# Patient Record
Sex: Male | Born: 1945
Health system: Southern US, Community
[De-identification: ages and names within clinical notes are randomized; demographics above are authoritative.]

## PROBLEM LIST (undated history)

## (undated) DIAGNOSIS — I4891 Unspecified atrial fibrillation: Secondary | ICD-10-CM

## (undated) DIAGNOSIS — E119 Type 2 diabetes mellitus without complications: Secondary | ICD-10-CM

## (undated) DIAGNOSIS — E785 Hyperlipidemia, unspecified: Secondary | ICD-10-CM

## (undated) DIAGNOSIS — I10 Essential (primary) hypertension: Secondary | ICD-10-CM

---

## 1998-03-21 ENCOUNTER — Ambulatory Visit: Admission: RE | Admit: 1998-03-21 | Discharge: 1998-03-21 | Payer: Self-pay | Admitting: Internal Medicine

## 1998-04-18 ENCOUNTER — Ambulatory Visit (HOSPITAL_COMMUNITY): Admission: RE | Admit: 1998-04-18 | Discharge: 1998-04-18 | Payer: Self-pay | Admitting: Internal Medicine

## 1998-05-04 ENCOUNTER — Emergency Department (HOSPITAL_COMMUNITY): Admission: EM | Admit: 1998-05-04 | Discharge: 1998-05-04 | Payer: Self-pay

## 1999-11-09 ENCOUNTER — Ambulatory Visit (HOSPITAL_COMMUNITY): Admission: RE | Admit: 1999-11-09 | Discharge: 1999-11-09 | Payer: Self-pay | Admitting: Urology

## 1999-11-23 ENCOUNTER — Encounter (INDEPENDENT_AMBULATORY_CARE_PROVIDER_SITE_OTHER): Payer: Self-pay | Admitting: Specialist

## 1999-11-23 ENCOUNTER — Ambulatory Visit (HOSPITAL_COMMUNITY): Admission: RE | Admit: 1999-11-23 | Discharge: 1999-11-23 | Payer: Self-pay | Admitting: Urology

## 2000-01-12 ENCOUNTER — Emergency Department (HOSPITAL_COMMUNITY): Admission: EM | Admit: 2000-01-12 | Discharge: 2000-01-12 | Payer: Self-pay

## 2000-01-29 ENCOUNTER — Ambulatory Visit (HOSPITAL_COMMUNITY): Admission: RE | Admit: 2000-01-29 | Discharge: 2000-01-29 | Payer: Self-pay | Admitting: *Deleted

## 2000-01-29 ENCOUNTER — Encounter (INDEPENDENT_AMBULATORY_CARE_PROVIDER_SITE_OTHER): Payer: Self-pay | Admitting: Specialist

## 2001-01-03 ENCOUNTER — Encounter: Payer: Self-pay | Admitting: Urology

## 2001-01-03 ENCOUNTER — Encounter: Admission: RE | Admit: 2001-01-03 | Discharge: 2001-01-03 | Payer: Self-pay | Admitting: Urology

## 2001-01-13 ENCOUNTER — Encounter: Admission: RE | Admit: 2001-01-13 | Discharge: 2001-01-13 | Payer: Self-pay | Admitting: Urology

## 2001-01-13 ENCOUNTER — Encounter: Payer: Self-pay | Admitting: Urology

## 2001-01-27 ENCOUNTER — Encounter: Payer: Self-pay | Admitting: Urology

## 2001-01-27 ENCOUNTER — Ambulatory Visit (HOSPITAL_COMMUNITY): Admission: RE | Admit: 2001-01-27 | Discharge: 2001-01-27 | Payer: Self-pay | Admitting: Urology

## 2001-01-30 ENCOUNTER — Ambulatory Visit (HOSPITAL_COMMUNITY): Admission: RE | Admit: 2001-01-30 | Discharge: 2001-01-30 | Payer: Self-pay | Admitting: Urology

## 2007-03-05 ENCOUNTER — Encounter (INDEPENDENT_AMBULATORY_CARE_PROVIDER_SITE_OTHER): Payer: Self-pay | Admitting: *Deleted

## 2007-03-05 ENCOUNTER — Ambulatory Visit (HOSPITAL_COMMUNITY): Admission: RE | Admit: 2007-03-05 | Discharge: 2007-03-05 | Payer: Self-pay | Admitting: *Deleted

## 2011-01-02 NOTE — Op Note (Signed)
Manuel Turner, Manuel Turner                ACCOUNT NO.:  [id number]   MEDICAL RECORD NO.:  [id number]          PATIENT TYPE:  AMB   LOCATION:  ENDO                         FACILITY:  [facility]   PHYSICIAN:  Georgiana Spinner, M.D.    DATE OF BIRTH:  05-14-1946   DATE OF PROCEDURE:  03/05/2007  DATE OF DISCHARGE:                               OPERATIVE REPORT   PROCEDURE:  Colonoscopy.   ENDOSCOPIST:  Georgiana Spinner, M.D.   INDICATIONS:  Colon polyps.   ANESTHESIA:  Demerol 120 mg, Versed 15 mg.   PROCEDURE:  With the patient mildly sedated in the left lateral  decubitus position, the Pentax videoscopic colonoscope was inserted into  the rectum after a rectal examination was attempted; it was passed under  direct vision with pressure applied to reach the cecum, identified by  base of cecum and ileocecal valve, both of which were photographed.  In  the cecum was a small polyp which was photographed and removed using hot  biopsy forceps technique, setting of 20/150 blended current.  We then  withdrew the colonoscope, taking circumferential views of the remaining  colonic mucosa, stopping at the splenic flexure area, where a second  polyp was seen, photographed and it too was removed, this time using  snare cautery technique, again with a setting of 20/150 blended current.  We then withdrew all the way to the rectum, which appeared normal on  direct and showed hemorrhoids on retroflexed view and a small polyp on  retroflexed view that was removed using hot biopsy forceps technique  with the same setting.  The endoscope was straightened and withdrawn.  The patient's vital signs and pulse oximetry remained stable.  The  patient tolerated the procedure well and without apparent complications.   FINDINGS:  Polyp of cecum and rectum and a larger polyp at the splenic  flexure, all removed, await biopsy report.  The patient will call me for  results and follow up with me as an outpatient.     ______________________________  Georgiana Spinner, M.D.     GMO/MEDQ  D:  03/05/2007  T:  03/06/2007  Job:  [id number]

## 2011-01-05 NOTE — Procedures (Signed)
[facility]  Patient:    Manuel Turner, Manuel Turner                       MRN: [id number] Proc. Date: 01/29/00 Adm. Date:  [id number] Disc. Date: [id number] Attending:  Sabino Gasser                           Procedure Report  PROCEDURE PERFORMED:  Colonoscopy.  ENDOSCOPIST:  Sabino Gasser, M.D.  INDICATIONS FOR PROCEDURE:  Rectal pain, colon polyps, family history of colon cancer.  ANESTHESIA:  Demerol 100 mg, Versed 10 mg was given intravenously in divided dose.  DESCRIPTION OF PROCEDURE:  With the patient mildly sedated in the left lateral decubitus position, the Olympus videoscopic colonoscope was inserted in the rectum and passed under direct vision into the cecum.  The cecum was identified by the ileocecal valve and appendiceal orifice, both of which were photographed.  From this point, the colonoscope was slowly withdrawn, taking circumferential views of the entire colonic mucosa stopping only in the ascending colon where a small polyp was seen and photographed and using hot biopsy forceps technique setting at 20/20 blended current, it was removed. The endoscope was then withdrawn all the way to approximately 25 cm where a second small polyp was seen and photographed and removed using hot biopsy forceps technique setting of 20/20 blended current.  The colonoscope was next pulled back to the rectum which appeared normal on direct view and showed internal hemorrhoids on retroflex view.  The endoscope was straightened and withdrawn.  Patients vital signs and pulse oximeter remained stable.  The patient tolerated the procedure well and without apparent complications.  FINDINGS:  Polyps of the ascending colon at 25 cm from the anal verge.  Await biopsy report.  Patient will call me for results and follow up with me as needed as an outpatient.  Additionally, internal hemorrhoids seen. DD:  01/29/00 TD:  01/31/00 Job: [id number]

## 2011-01-05 NOTE — Op Note (Signed)
[facility]  Patient:    Manuel Turner, Manuel Turner                       MRN: [id number] Proc. Date: 01/30/01 Adm. Date:  [id number] Attending:  Laqueta Jean                           Operative Report  PREOPERATIVE DIAGNOSIS.  Left hydronephrosis.  POSTOPERATIVE DIAGNOSIS:  Left ureteropelvic junction obstruction.  OPERATION:  Cystourethroscopy, left retrograde pyelogram, left ureteroscopy.  SURGEON:  Sigmund I. Patsi Sears, M.D.  ANESTHESIA:  General (LMA).  PREPARATION:  After appropriate preanesthesia, the patient was brought to the operating room, placed on the operating table in the dorsal supine position where general LMA anesthesia was introduced.  He was then replaced in the dorsal lithotomy position where the pubis was prepped with Betadine solution and draped in the usual fashion.  DESCRIPTION OF PROCEDURE:  Cystoscopy revealed normal-appearing bladder, and left retrograde pyelogram revealed normal-appearing ureter with a very definite high insertion, very tight and narrow UPJ with caliectasis, and a distended renal pelvis.  Ureteroscope was attempted to be passed into this area, but the ureter was too small and too tight for a comfortable passage of the , equal to the ureteroscope.  Therefore, the wire was removed, and Xylocaine jelly placed in the bladder.  The patient was then awakened after a B&O suppository given.  The patient was taken to the recovery room in good condition. DD:  01/30/01 TD:  01/30/01 Job: [id number]

## 2013-08-10 ENCOUNTER — Encounter: Payer: Self-pay | Admitting: Nurse Practitioner

## 2013-08-10 NOTE — Progress Notes (Signed)
This encounter was created in error - please disregard.

## 2014-03-10 ENCOUNTER — Emergency Department (HOSPITAL_BASED_OUTPATIENT_CLINIC_OR_DEPARTMENT_OTHER)
Admission: EM | Admit: 2014-03-10 | Discharge: 2014-03-10 | Disposition: A | Discharge status: Home / Self Care | Payer: Medicare HMO | Attending: Emergency Medicine | Admitting: Emergency Medicine

## 2014-03-10 ENCOUNTER — Encounter (HOSPITAL_BASED_OUTPATIENT_CLINIC_OR_DEPARTMENT_OTHER): Payer: Self-pay | Admitting: Emergency Medicine

## 2014-03-10 ENCOUNTER — Emergency Department (HOSPITAL_BASED_OUTPATIENT_CLINIC_OR_DEPARTMENT_OTHER): Payer: Medicare HMO

## 2014-03-10 DIAGNOSIS — E119 Type 2 diabetes mellitus without complications: Secondary | ICD-10-CM | POA: Diagnosis not present

## 2014-03-10 DIAGNOSIS — S79919A Unspecified injury of unspecified hip, initial encounter: Secondary | ICD-10-CM | POA: Insufficient documentation

## 2014-03-10 DIAGNOSIS — I4891 Unspecified atrial fibrillation: Secondary | ICD-10-CM | POA: Insufficient documentation

## 2014-03-10 DIAGNOSIS — I10 Essential (primary) hypertension: Secondary | ICD-10-CM | POA: Diagnosis present

## 2014-03-10 DIAGNOSIS — S7010XA Contusion of unspecified thigh, initial encounter: Secondary | ICD-10-CM | POA: Insufficient documentation

## 2014-03-10 DIAGNOSIS — M25512 Pain in left shoulder: Secondary | ICD-10-CM

## 2014-03-10 DIAGNOSIS — R631 Polydipsia: Secondary | ICD-10-CM | POA: Diagnosis not present

## 2014-03-10 DIAGNOSIS — Y9289 Other specified places as the place of occurrence of the external cause: Secondary | ICD-10-CM | POA: Diagnosis not present

## 2014-03-10 DIAGNOSIS — Y9389 Activity, other specified: Secondary | ICD-10-CM | POA: Diagnosis not present

## 2014-03-10 DIAGNOSIS — Z7902 Long term (current) use of antithrombotics/antiplatelets: Secondary | ICD-10-CM | POA: Insufficient documentation

## 2014-03-10 DIAGNOSIS — Z7901 Long term (current) use of anticoagulants: Secondary | ICD-10-CM | POA: Diagnosis not present

## 2014-03-10 DIAGNOSIS — Z79899 Other long term (current) drug therapy: Secondary | ICD-10-CM | POA: Insufficient documentation

## 2014-03-10 DIAGNOSIS — R296 Repeated falls: Secondary | ICD-10-CM | POA: Insufficient documentation

## 2014-03-10 DIAGNOSIS — S4980XA Other specified injuries of shoulder and upper arm, unspecified arm, initial encounter: Secondary | ICD-10-CM | POA: Insufficient documentation

## 2014-03-10 DIAGNOSIS — E785 Hyperlipidemia, unspecified: Secondary | ICD-10-CM | POA: Diagnosis not present

## 2014-03-10 DIAGNOSIS — R42 Dizziness and giddiness: Secondary | ICD-10-CM | POA: Diagnosis not present

## 2014-03-10 DIAGNOSIS — S46909A Unspecified injury of unspecified muscle, fascia and tendon at shoulder and upper arm level, unspecified arm, initial encounter: Secondary | ICD-10-CM | POA: Insufficient documentation

## 2014-03-10 DIAGNOSIS — S79929A Unspecified injury of unspecified thigh, initial encounter: Secondary | ICD-10-CM

## 2014-03-10 HISTORY — DX: Essential (primary) hypertension: I10

## 2014-03-10 HISTORY — DX: Type 2 diabetes mellitus without complications: E11.9

## 2014-03-10 HISTORY — DX: Hyperlipidemia, unspecified: E78.5

## 2014-03-10 HISTORY — DX: Unspecified atrial fibrillation: I48.91

## 2014-03-10 MED ORDER — HYDROCODONE-ACETAMINOPHEN 5-325 MG PO TABS
2.0000 | ORAL_TABLET | Freq: Once | ORAL | Status: AC
  Administered 2014-03-10: 2 via ORAL
  Filled 2014-03-10: qty 2

## 2014-03-10 MED ORDER — HYDROCODONE-ACETAMINOPHEN 5-325 MG PO TABS: 1.0000 | ORAL_TABLET | Freq: Four times a day (QID) | ORAL | Status: DC | PRN

## 2014-03-10 NOTE — ED Notes (Signed)
Pt reports elevated BP today. Sts taking it 5-6 times with different HIGH readings. Reports some dizziness this morning. Denies CP, nausea, vomiting.

## 2014-03-10 NOTE — ED Notes (Signed)
MD at bedside. 

## 2014-03-10 NOTE — Discharge Instructions (Signed)

## 2014-03-10 NOTE — ED Provider Notes (Signed)
CSN: [id number]     Arrival date & time 03/10/14  1800 History   This chart was scribed for Manuel Hait, MD, by Yevette Edwards, ED Scribe. This patient was seen in room MH07/MH07 and the patient's care was started at 6:27 PM.  First MD Initiated Contact with Patient 03/10/14 1816     Chief Complaint  Patient presents with  . Hypertension    Patient is a 68 y.o. male presenting with hypertension. The history is provided by the patient. No language interpreter was used.  Hypertension This is a new problem. The current episode started 6 to 12 hours ago. The problem occurs constantly. The problem has not changed since onset.Pertinent negatives include no chest pain, no headaches and no shortness of breath. Nothing aggravates the symptoms. The symptoms are relieved by medications. He has tried rest and ASA for the symptoms. The treatment provided moderate relief.   HPI Comments: Manuel Turner is a 68 y.o. male, with a h/o DM, HTN, and hyperlipidemia, who presents to the Emergency Department complaining of elevated BP which was measured today.  Manuel Turner states his BP was 151/119 at home this morning. The pt denies blurred vision, a headache, chest pain, or SOB. He reports he has taken his medication as prescribed.  The pt reports normal BPs of 140/70; in the ED his BP is 168/91. He voices a fall four days ago in which he fell upon his left arm, left hip and left knee when his puppy's chain became wrapped around his ankles. He reports pain associated with the sites, and the pain has decreased his appetite. Manuel Turner also questions if the fall or subsequent pain could contribute to his elevated BP. He has used 0.5 tablet of hydrocodone to mitigate the pain with temporary relief.    Past Medical History  Diagnosis Date  . Diabetes mellitus without complication   . Hypertension   . Hyperlipemia   . Atrial fibrillation    History reviewed. No pertinent past surgical history. No family history  on file. History  Substance Use Topics  . Smoking status: Not on file  . Smokeless tobacco: Not on file  . Alcohol Use: No    Review of Systems  Eyes: Negative for visual disturbance.  Respiratory: Negative for shortness of breath.   Cardiovascular: Negative for chest pain.  Endocrine: Positive for polydipsia.  Musculoskeletal: Positive for myalgias.  Neurological: Positive for dizziness. Negative for headaches.  All other systems reviewed and are negative.   Allergies  Review of patient's allergies indicates not on file.  Home Medications   Prior to Admission medications   Medication Sig Start Date End Date Taking? Authorizing Provider  atorvastatin (LIPITOR) 20 MG tablet Take 20 mg by mouth daily.   Yes Historical Provider, MD  clopidogrel (PLAVIX) 75 MG tablet Take 75 mg by mouth daily.   Yes Historical Provider, MD  digoxin (LANOXIN) 0.125 MG tablet Take 0.375 mg by mouth daily.   Yes Historical Provider, MD  eplerenone (INSPRA) 25 MG tablet Take 25 mg by mouth daily.   Yes Historical Provider, MD  losartan (COZAAR) 25 MG tablet Take 25 mg by mouth daily.   Yes Historical Provider, MD  metFORMIN (GLUCOPHAGE) 1000 MG tablet Take 1,000 mg by mouth 2 (two) times daily with a meal.   Yes Historical Provider, MD  warfarin (COUMADIN) 4 MG tablet Take 4 mg by mouth daily.   Yes Historical Provider, MD   Triage Vitals: BP 168/91  Pulse  82  Temp(Src) 98.5 F (36.9 C) (Oral)  Resp 16  Ht 6\' 2"  (1.88 m)  Wt 250 lb (113.399 kg)  BMI 32.08 kg/m2  SpO2 100%  Physical Exam  Nursing note and vitals reviewed. Constitutional: He is oriented to person, place, and time. He appears well-developed and well-nourished. No distress.  HENT:  Head: Normocephalic and atraumatic.  Eyes: Conjunctivae and EOM are normal.  Neck: Neck supple. No tracheal deviation present.  Cardiovascular: Normal rate.   Pulmonary/Chest: Effort normal. No respiratory distress.  Musculoskeletal: Normal range of  motion.  Neurological: He is alert and oriented to person, place, and time.  Skin: Skin is warm and dry.  20 cm long to 10 cm wide bruise to left thigh with underlying hematoma and mild tenderness.   Psychiatric: He has a normal mood and affect. His behavior is normal.    ED Course  Procedures (including critical care time)  DIAGNOSTIC STUDIES: Oxygen Saturation is 100% on room air, normal by my interpretation.    COORDINATION OF CARE:  6:42 PM- Discussed treatment plan with patient, and the patient agreed to the plan. The plan includes Vicodin, muscle relaxants, and imaging of his left shoulder.   Labs Review Labs Reviewed - No data to display  Imaging Review Dg Hip Complete Left  03/10/2014   CLINICAL DATA:  Status post fall 4 days ago.  Left hip pain.  EXAM: LEFT HIP - COMPLETE 2+ VIEW  COMPARISON:  None.  FINDINGS: There is no acute bony or joint abnormality. No notable degenerative change is seen. No focal bony lesion is identified. Soft tissue structures are unremarkable.  IMPRESSION: Negative exam.   Electronically Signed   By: Drusilla Kannerhomas  Dalessio M.D.   On: 03/10/2014 19:26   Dg Shoulder Left  03/10/2014   CLINICAL DATA:  Status post fall 4 days ago.  Left shoulder pain.  EXAM: LEFT SHOULDER - 2+ VIEW  COMPARISON:  None.  FINDINGS: The humerus is located and the acromioclavicular joint is intact. Mild acromioclavicular degenerative change is seen. Imaged left lung and ribs are unremarkable.  IMPRESSION: Negative exam.   Electronically Signed   By: Drusilla Kannerhomas  Dalessio M.D.   On: 03/10/2014 19:25     EKG Interpretation None      MDM   Final diagnoses:  Essential hypertension  Shoulder pain, acute, left    43M presents with HTN, hip and shoulder pain. HTN this morning, hasn't missed any meds. Denies CP, SOB, HA, blurry vision. Stated some mild dizziness and thirst this morning, but hasn't been eating due to the pain in his shoulder. Fell 4 days ago in the driveway. Caught  himself with his left arm, having some L shoulder pain.  No signs of Hypertensive Emergency, likely hypertensive due to pain. Large bruise on L lateral buttock. Spasm in L deltoid. Will xray hip and shoulder. Pain meds given.  Xrays ok. Stable for discharge.   I personally performed the services described in this documentation, which was scribed in my presence. The recorded information has been reviewed and is accurate.     Manuel HaitWilliam Bay Wayson, MD 03/10/14 [phone]

## 2014-03-10 NOTE — ED Notes (Signed)
Pt c/o increased BP 151/102 .

## 2014-03-16 ENCOUNTER — Encounter: Payer: Self-pay | Admitting: Interventional Cardiology

## 2014-03-16 ENCOUNTER — Ambulatory Visit (INDEPENDENT_AMBULATORY_CARE_PROVIDER_SITE_OTHER): Payer: Medicare HMO | Admitting: Interventional Cardiology

## 2014-03-16 VITALS — BP 150/90 | HR 84 | Ht 74.0 in | Wt 247.0 lb

## 2014-03-16 DIAGNOSIS — I4891 Unspecified atrial fibrillation: Secondary | ICD-10-CM

## 2014-03-16 DIAGNOSIS — R600 Localized edema: Secondary | ICD-10-CM

## 2014-03-16 DIAGNOSIS — I10 Essential (primary) hypertension: Secondary | ICD-10-CM | POA: Insufficient documentation

## 2014-03-16 DIAGNOSIS — I482 Chronic atrial fibrillation, unspecified: Secondary | ICD-10-CM

## 2014-03-16 DIAGNOSIS — R609 Edema, unspecified: Secondary | ICD-10-CM

## 2014-03-16 DIAGNOSIS — Z7901 Long term (current) use of anticoagulants: Secondary | ICD-10-CM

## 2014-03-16 NOTE — Progress Notes (Signed)
Patient ID: Manuel Turner, male   DOB: 28-Oct-1945, 68 y.o.   MRN: [id number]   Date: 03/16/2014 ID: Manuel Turner, DOB 07/04/1946, MRN [id number] PCP: No primary provider on file.  Reason: Atrial fibrillation, self-referred  ASSESSMENT;  1. Chronic atrial fibrillation, first diagnosed in 1985 with associated LV enlargement and decreased function that is subsequently resolved according to the patient. 2. Hypertension 3. Hyperlipidemia 4. Diabetes mellitus  PLAN:  1. Continue the current medical regimen until I have the opportunity to review records from [facility] 2. We'll consider decreasing the dose of digoxin as the patient ages 89. I need to document the patient's current LV systolic function. I would not order any tests until we see him back in 6 months 4. Clinical followup in 6 months.   SUBJECTIVE: Manuel Turner is a 68 y.o. male who is who is here to establish for followup of chronic atrial fibrillation. He was first diagnosed in 88. At that time there was also evidence of left ventricular dysfunction and LV enlargement. LV function according to the patient improved after management of atrial fibrillation was completed. He is done well since that time previous to be taken care of by Dr. Aggie Cosier. Dr. Lucas Mallow retired 4 years ago he has not seen a cardiologist since that time. His INR symptom management Dr. Richardson Dopp had had decreased [facility]. He has no specific cardiovascular complaints. He has not had bleeding on anticoagulant therapy. He denies neurological complaints. He has bilateral lower extremity edema.   Not on File  Current Outpatient Prescriptions on File Prior to Visit  Medication Sig Dispense Refill  . atorvastatin (LIPITOR) 20 MG tablet Take 20 mg by mouth daily.      . digoxin (LANOXIN) 0.125 MG tablet Take 0.375 mg by mouth daily.      Marland Kitchen eplerenone (INSPRA) 25 MG tablet Take 25 mg by mouth daily.      Marland Kitchen HYDROcodone-acetaminophen  (NORCO/VICODIN) 5-325 MG per tablet Take 1 tablet by mouth every 6 (six) hours as needed for moderate pain or severe pain.  15 tablet  0  . metFORMIN (GLUCOPHAGE) 1000 MG tablet Take 1,000 mg by mouth 2 (two) times daily with a meal.      . warfarin (COUMADIN) 4 MG tablet Take 4 mg by mouth daily.       No current facility-administered medications on file prior to visit.    Past Medical History  Diagnosis Date  . Diabetes mellitus without complication   . Hypertension   . Hyperlipemia   . Atrial fibrillation     No past surgical history on file.  History   Social History  . Marital Status: Married    Spouse Name: N/A    Number of Children: N/A  . Years of Education: N/A   Occupational History  . Not on file.   Social History Main Topics  . Smoking status: Former Games developer  . Smokeless tobacco: Not on file  . Alcohol Use: No  . Drug Use: Not on file  . Sexual Activity: Not on file   Other Topics Concern  . Not on file   Social History Narrative  . No narrative on file    No family history on file.  ROS: He has had no transient neurological complaints. No blood in his urine or stool. He has bilateral lower extremity edema, right leg greater than left. No history of DVT. He's been on chronic anticoagulation therapy since 1985. No history of  stroke. No history of coronary disease. No history of syncope. He denies palpitations.. Other systems negative for complaints.  OBJECTIVE: BP 150/90  Pulse 84  Ht  (1.88 m)  Wt 247 lb (112.038 kg)  BMI 31.70 kg/m2,  General: No acute distress, obese but otherwise healthy-appearing HEENT: normal no pallor or jaundice Neck: JVD mild elevation with the patient lying at 30 absent elevation with the patient lying at 45. Carotids no bruits are heard Chest: Clear Cardiac: Murmur: None. Gallop: None. Rhythm: Irregularly irregular. Other: Normal Abdomen: Bruit: Absent. Pulsation: Absent Extremities: Edema: 2+ right lower extremity  1-2+ left lower extremity. Pulses: 2+ and symmetric Neuro: Normal Psych: Normal  ECG: A. fib with controlled ventricular response at 85 beats per min

## 2014-03-16 NOTE — Patient Instructions (Signed)
Your physician recommends that you continue on your current medications as directed. Please refer to the Current Medication list given to you today.  Your physician wants you to follow-up in: 6 months with Dr.Smith You will receive a reminder letter in the mail two months in advance. If you don't receive a letter, please call our office to schedule the follow-up appointment.  

## 2014-06-24 ENCOUNTER — Encounter (HOSPITAL_BASED_OUTPATIENT_CLINIC_OR_DEPARTMENT_OTHER): Payer: Medicare PPO | Attending: Internal Medicine

## 2014-06-24 DIAGNOSIS — L97519 Non-pressure chronic ulcer of other part of right foot with unspecified severity: Secondary | ICD-10-CM | POA: Insufficient documentation

## 2014-06-24 DIAGNOSIS — E11621 Type 2 diabetes mellitus with foot ulcer: Secondary | ICD-10-CM | POA: Insufficient documentation

## 2014-06-24 NOTE — Progress Notes (Signed)
[facility]  NAME:  Manuel Turner, Manuel Turner                ACCOUNT NO.:  [id number]  MEDICAL RECORD NO.:  [id number]      DATE OF BIRTH:  1945-09-01  PHYSICIAN:  Maxwell CaulMichael G. Imari Sivertsen, M.D. VISIT DATE:  06/24/2014                                  OFFICE VISIT   HISTORY OF PRESENT ILLNESS:  Manuel Turner is a 68 year old man kindly referred by [facility] Ad[facility]ankennis [facility]ut [facility].  He is a gentleman who has type 2 diabetes, on metformin.  He tells me that he was mowing his lawn in June with no socks under his shoes.  He developed a large blister on the plantar aspect of his right great toe extending to the plantar aspect of the first metatarsal phalangeal joint. Eventually, the blister resolved; however, he was left with an open wound on the plantar aspect of his right great toe.  Initially, he used a topical cream to this.  After researching on the Internet, he requested Regranex, I think which he is on his second tube.  He also has surrounding callus that he has been tearing down himself.  He does not use diabetic foot wear.  PAST MEDICAL HISTORY:  Includes type 2 diabetes, AFib, and hypertension.  CURRENT MEDICATIONS:  Coumadin 4 mg a day, Lanoxin 0.125 daily, Cozaar 100 daily, Valium 5 mg b.i.d., metformin 1000 b.i.d., atorvastatin 20 mg daily.  PHYSICAL EXAMINATION:  VITAL SIGNS:  Temperature 98.4, pulse 76, respirations 16, blood pressure 147/101. EXTREMITIES:  Vascular peripheral pulses are palpable in the right foot. His ankle-brachial index on the right is 1.02.  The area in question over the first metatarsal head plantar aspect initially measured 0.6 x 0.3 x 0.3; however, there was undermining here.  With a scalpel, the overlying callus was removed.  Post debridement measurements were 1 x 0.8 x 0.3.  There is no signs of overt infection.  The surface of the wound appeared to be healthy.  IMPRESSIONS/PLAN:  Wegner's 2 diabetic foot ulcer as described.   The area underwent a non surgical debridement removing overhanging callus. No cultures were felt to be necessary.  In terms of dressings, we allowed him to continue the Regranex that he is using all ready.  I spent a fair amount of time talking to him about adequate offloading. 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 Nevertheless, I think this is probably the major issue here on top of probable diabetic neuropathy.  We offloaded the area further in a shoe with some surrounding felt and a gauze wrap.  He will continue the Regranex.  I will see him again in 2 weeks.  If there is progression of this wound, I would suggest another look at more aggressive off-loading.          ______________________________ Maxwell CaulMichael G. Adryan Druckenmiller, M.D.     MGR/MEDQ  D:  06/24/2014  T:  06/24/2014  Job:  [id number]

## 2014-07-08 DIAGNOSIS — E11621 Type 2 diabetes mellitus with foot ulcer: Secondary | ICD-10-CM | POA: Diagnosis not present

## 2014-07-08 DIAGNOSIS — L97519 Non-pressure chronic ulcer of other part of right foot with unspecified severity: Secondary | ICD-10-CM | POA: Diagnosis not present

## 2014-07-22 ENCOUNTER — Encounter (HOSPITAL_BASED_OUTPATIENT_CLINIC_OR_DEPARTMENT_OTHER): Payer: Medicare PPO | Attending: Internal Medicine

## 2014-07-22 DIAGNOSIS — L97519 Non-pressure chronic ulcer of other part of right foot with unspecified severity: Secondary | ICD-10-CM | POA: Insufficient documentation

## 2014-07-22 DIAGNOSIS — E11621 Type 2 diabetes mellitus with foot ulcer: Secondary | ICD-10-CM | POA: Insufficient documentation

## 2014-07-22 DIAGNOSIS — E1140 Type 2 diabetes mellitus with diabetic neuropathy, unspecified: Secondary | ICD-10-CM | POA: Insufficient documentation

## 2014-08-05 DIAGNOSIS — E1140 Type 2 diabetes mellitus with diabetic neuropathy, unspecified: Secondary | ICD-10-CM | POA: Diagnosis present

## 2014-08-05 DIAGNOSIS — E11621 Type 2 diabetes mellitus with foot ulcer: Secondary | ICD-10-CM | POA: Diagnosis not present

## 2014-08-05 DIAGNOSIS — L97519 Non-pressure chronic ulcer of other part of right foot with unspecified severity: Secondary | ICD-10-CM | POA: Diagnosis not present

## 2014-08-26 ENCOUNTER — Encounter (HOSPITAL_BASED_OUTPATIENT_CLINIC_OR_DEPARTMENT_OTHER): Payer: Commercial Managed Care - HMO | Attending: Internal Medicine

## 2014-08-26 DIAGNOSIS — L97519 Non-pressure chronic ulcer of other part of right foot with unspecified severity: Secondary | ICD-10-CM | POA: Insufficient documentation

## 2014-08-26 DIAGNOSIS — E11621 Type 2 diabetes mellitus with foot ulcer: Secondary | ICD-10-CM | POA: Insufficient documentation

## 2014-09-02 DIAGNOSIS — E11621 Type 2 diabetes mellitus with foot ulcer: Secondary | ICD-10-CM | POA: Diagnosis not present

## 2014-09-02 DIAGNOSIS — L97519 Non-pressure chronic ulcer of other part of right foot with unspecified severity: Secondary | ICD-10-CM | POA: Diagnosis not present

## 2014-09-13 ENCOUNTER — Ambulatory Visit: Payer: Medicare PPO | Admitting: Interventional Cardiology

## 2014-09-14 DIAGNOSIS — Z7901 Long term (current) use of anticoagulants: Secondary | ICD-10-CM | POA: Diagnosis not present

## 2014-09-23 ENCOUNTER — Encounter (HOSPITAL_BASED_OUTPATIENT_CLINIC_OR_DEPARTMENT_OTHER): Payer: Medicare PPO | Attending: Internal Medicine

## 2014-09-29 ENCOUNTER — Encounter: Payer: Self-pay | Admitting: Interventional Cardiology

## 2014-09-30 DIAGNOSIS — Z7901 Long term (current) use of anticoagulants: Secondary | ICD-10-CM | POA: Diagnosis not present

## 2014-09-30 DIAGNOSIS — E789 Disorder of lipoprotein metabolism, unspecified: Secondary | ICD-10-CM | POA: Diagnosis not present

## 2014-09-30 DIAGNOSIS — E119 Type 2 diabetes mellitus without complications: Secondary | ICD-10-CM | POA: Diagnosis not present

## 2014-09-30 DIAGNOSIS — E139 Other specified diabetes mellitus without complications: Secondary | ICD-10-CM | POA: Diagnosis not present

## 2014-10-07 DIAGNOSIS — E118 Type 2 diabetes mellitus with unspecified complications: Secondary | ICD-10-CM | POA: Diagnosis not present

## 2014-10-07 DIAGNOSIS — I4891 Unspecified atrial fibrillation: Secondary | ICD-10-CM | POA: Diagnosis not present

## 2014-10-07 DIAGNOSIS — I10 Essential (primary) hypertension: Secondary | ICD-10-CM | POA: Diagnosis not present

## 2014-10-07 DIAGNOSIS — M25511 Pain in right shoulder: Secondary | ICD-10-CM | POA: Diagnosis not present

## 2014-12-07 ENCOUNTER — Encounter: Payer: Self-pay | Admitting: Interventional Cardiology

## 2014-12-07 ENCOUNTER — Ambulatory Visit (INDEPENDENT_AMBULATORY_CARE_PROVIDER_SITE_OTHER): Payer: Commercial Managed Care - HMO | Admitting: Interventional Cardiology

## 2014-12-07 VITALS — BP 140/60 | HR 64 | Ht 74.0 in | Wt 230.8 lb

## 2014-12-07 DIAGNOSIS — I10 Essential (primary) hypertension: Secondary | ICD-10-CM

## 2014-12-07 DIAGNOSIS — I482 Chronic atrial fibrillation, unspecified: Secondary | ICD-10-CM

## 2014-12-07 DIAGNOSIS — I509 Heart failure, unspecified: Secondary | ICD-10-CM | POA: Diagnosis not present

## 2014-12-07 DIAGNOSIS — I4891 Unspecified atrial fibrillation: Secondary | ICD-10-CM | POA: Diagnosis not present

## 2014-12-07 DIAGNOSIS — Z7901 Long term (current) use of anticoagulants: Secondary | ICD-10-CM

## 2014-12-07 DIAGNOSIS — I5030 Unspecified diastolic (congestive) heart failure: Secondary | ICD-10-CM | POA: Insufficient documentation

## 2014-12-07 NOTE — Progress Notes (Signed)
Cardiology Office Note   Date:  12/07/2014   ID:  Manuel Turner, DOB [birth year], MRN [id number]  PCP:  Michiel Sites, MD  Cardiologist:   Lesleigh Noe, MD   Chief Complaint  Patient presents with  . Atrial Fibrillation      History of Present Illness: Manuel Turner is a 69 y.o. male who presents for chronic atrial fibrillation, chronic anticoagulation, HFrEF improved, and hypertension.  He is doing well. He denies exertional intolerance. When atrial fibrillation was diagnosed in 1985, he underwent an extensive workup at [facility]. No etiology was found. Patient stated they told him he had myocarditis. Atrial fibrillation has been present continuously since that time. He has no medication complaints or symptoms.  Past Medical History  Diagnosis Date  . Diabetes mellitus without complication   . Hypertension   . Hyperlipemia   . Atrial fibrillation     History reviewed. No pertinent past surgical history.   Current Outpatient Prescriptions  Medication Sig Dispense Refill  . atorvastatin (LIPITOR) 20 MG tablet Take 20 mg by mouth daily.    Marland Kitchen CINNAMON PO Take 1,000 mg by mouth.    . digoxin (LANOXIN) 0.125 MG tablet Take 0.375 mg by mouth daily.    Marland Kitchen eplerenone (INSPRA) 25 MG tablet Take 25 mg by mouth daily.    Marland Kitchen losartan (COZAAR) 100 MG tablet Take 100 mg by mouth daily.    . metFORMIN (GLUCOPHAGE) 1000 MG tablet Take 1,000 mg by mouth 2 (two) times daily with a meal.    . sitaGLIPtin (JANUVIA) 100 MG tablet Take 100 mg by mouth daily.    Marland Kitchen warfarin (COUMADIN) 4 MG tablet Take 4 mg by mouth daily. Pt. Taking 4 mg by mouth every other day and 5 mg the next following day     No current facility-administered medications for this visit.    Allergies:   Review of patient's allergies indicates no known allergies.    Social History:  The patient  reports that he has quit smoking. He does not have any smokeless tobacco history on file. He reports  that he does not drink alcohol.   Family History:  The patient's Family history is unknown by patient.    ROS:  Please see the history of present illness.   Otherwise, review of systems are positive for none.   All other systems are reviewed and negative.    PHYSICAL EXAM: VS:  BP 140/60 mmHg  Pulse 64  Ht  (1.88 m)  Wt 230 lb 12.8 oz (104.69 kg)  BMI 29.62 kg/m2 , BMI Body mass index is 29.62 kg/(m^2). GEN: Well nourished, well developed, in no acute distress HEENT: normal Neck: no JVD, carotid bruits, or masses Cardiac: IIRR; norubs, or gallops,no edema . 2/6 RUSB systolic murmur Respiratory:  clear to auscultation bilaterally, normal work of breathing GI: soft, nontender, nondistended, + BS MS: no deformity or atrophy Skin: warm and dry, no rash Neuro:  Strength and sensation are intact Psych: euthymic mood, full affect   EKG:  EKG is ordered today. The ekg ordered today demonstrates A. fib with   Recent Labs: No results found for requested labs within last 365 days.    Lipid Panel No results found for: CHOL, TRIG, HDL, CHOLHDL, VLDL, LDLCALC, LDLDIRECT    Wt Readings from Last 3 Encounters:  12/07/14 230 lb 12.8 oz (104.69 kg)  03/16/14 247 lb (112.038 kg)  03/10/14 250 lb (113.399 kg)  Other studies Reviewed: Additional studies/ records that were reviewed today include:    ASSESSMENT AND PLAN:  Atrial fibrillation, chronic: controlled  Essential hypertension: controlled  Chronic anticoagulation     Current medicines are reviewed at length with the patient today.  The patient does not have concerns regarding medicines.  The following changes have been made:  no change  Labs/ tests ordered today include:  No orders of the defined types were placed in this encounter.     Disposition:   FU with Mendel RyderH. Vladimir Lenhoff in 12  months   Signed, Lesleigh NoeSMITH III,Shakera Ebrahimi W, MD  12/07/2014 11:32 AM    [facility] [address],  [place], [place]  [address] Phone: [phone]; Fax: [phone]

## 2014-12-07 NOTE — Patient Instructions (Signed)

## 2015-01-20 DIAGNOSIS — Z125 Encounter for screening for malignant neoplasm of prostate: Secondary | ICD-10-CM | POA: Diagnosis not present

## 2015-01-20 DIAGNOSIS — E119 Type 2 diabetes mellitus without complications: Secondary | ICD-10-CM | POA: Diagnosis not present

## 2015-01-20 DIAGNOSIS — E789 Disorder of lipoprotein metabolism, unspecified: Secondary | ICD-10-CM | POA: Diagnosis not present

## 2015-01-20 DIAGNOSIS — I10 Essential (primary) hypertension: Secondary | ICD-10-CM | POA: Diagnosis not present

## 2015-01-27 DIAGNOSIS — E789 Disorder of lipoprotein metabolism, unspecified: Secondary | ICD-10-CM | POA: Diagnosis not present

## 2015-01-27 DIAGNOSIS — E118 Type 2 diabetes mellitus with unspecified complications: Secondary | ICD-10-CM | POA: Diagnosis not present

## 2015-01-27 DIAGNOSIS — I10 Essential (primary) hypertension: Secondary | ICD-10-CM | POA: Diagnosis not present

## 2015-05-23 DIAGNOSIS — I10 Essential (primary) hypertension: Secondary | ICD-10-CM | POA: Diagnosis not present

## 2015-05-23 DIAGNOSIS — E118 Type 2 diabetes mellitus with unspecified complications: Secondary | ICD-10-CM | POA: Diagnosis not present

## 2015-05-30 DIAGNOSIS — E118 Type 2 diabetes mellitus with unspecified complications: Secondary | ICD-10-CM | POA: Diagnosis not present

## 2015-05-30 DIAGNOSIS — Z23 Encounter for immunization: Secondary | ICD-10-CM | POA: Diagnosis not present

## 2015-05-30 DIAGNOSIS — I4891 Unspecified atrial fibrillation: Secondary | ICD-10-CM | POA: Diagnosis not present

## 2015-05-30 DIAGNOSIS — E789 Disorder of lipoprotein metabolism, unspecified: Secondary | ICD-10-CM | POA: Diagnosis not present

## 2015-06-13 DIAGNOSIS — Z23 Encounter for immunization: Secondary | ICD-10-CM | POA: Diagnosis not present

## 2015-10-03 DIAGNOSIS — E139 Other specified diabetes mellitus without complications: Secondary | ICD-10-CM | POA: Diagnosis not present

## 2015-10-03 DIAGNOSIS — E118 Type 2 diabetes mellitus with unspecified complications: Secondary | ICD-10-CM | POA: Diagnosis not present

## 2015-10-03 DIAGNOSIS — Z79899 Other long term (current) drug therapy: Secondary | ICD-10-CM | POA: Diagnosis not present

## 2015-10-11 DIAGNOSIS — I10 Essential (primary) hypertension: Secondary | ICD-10-CM | POA: Diagnosis not present

## 2015-10-11 DIAGNOSIS — E118 Type 2 diabetes mellitus with unspecified complications: Secondary | ICD-10-CM | POA: Diagnosis not present

## 2015-10-11 DIAGNOSIS — I4891 Unspecified atrial fibrillation: Secondary | ICD-10-CM | POA: Diagnosis not present

## 2016-02-09 DIAGNOSIS — E119 Type 2 diabetes mellitus without complications: Secondary | ICD-10-CM | POA: Diagnosis not present

## 2016-02-09 DIAGNOSIS — I10 Essential (primary) hypertension: Secondary | ICD-10-CM | POA: Diagnosis not present

## 2016-02-09 DIAGNOSIS — E118 Type 2 diabetes mellitus with unspecified complications: Secondary | ICD-10-CM | POA: Diagnosis not present

## 2016-02-09 DIAGNOSIS — E139 Other specified diabetes mellitus without complications: Secondary | ICD-10-CM | POA: Diagnosis not present

## 2016-02-09 DIAGNOSIS — E789 Disorder of lipoprotein metabolism, unspecified: Secondary | ICD-10-CM | POA: Diagnosis not present

## 2016-02-14 DIAGNOSIS — M25561 Pain in right knee: Secondary | ICD-10-CM | POA: Diagnosis not present

## 2016-02-16 DIAGNOSIS — E789 Disorder of lipoprotein metabolism, unspecified: Secondary | ICD-10-CM | POA: Diagnosis not present

## 2016-02-16 DIAGNOSIS — I4891 Unspecified atrial fibrillation: Secondary | ICD-10-CM | POA: Diagnosis not present

## 2016-02-16 DIAGNOSIS — E118 Type 2 diabetes mellitus with unspecified complications: Secondary | ICD-10-CM | POA: Diagnosis not present

## 2016-02-16 DIAGNOSIS — I10 Essential (primary) hypertension: Secondary | ICD-10-CM | POA: Diagnosis not present

## 2016-03-13 DIAGNOSIS — Z800 Family history of malignant neoplasm of digestive organs: Secondary | ICD-10-CM | POA: Diagnosis not present

## 2016-03-13 DIAGNOSIS — K5730 Diverticulosis of large intestine without perforation or abscess without bleeding: Secondary | ICD-10-CM | POA: Diagnosis not present

## 2016-03-13 DIAGNOSIS — Z1211 Encounter for screening for malignant neoplasm of colon: Secondary | ICD-10-CM | POA: Diagnosis not present

## 2016-04-06 ENCOUNTER — Telehealth: Payer: Self-pay | Admitting: Interventional Cardiology

## 2016-04-06 NOTE — Telephone Encounter (Signed)
New message   Request for surgical clearance:  1. What type of surgery is being performed?  colonoscopy  2. When is this surgery scheduled? 04-13-16  3. Are there any medications that need to be held prior to surgery and how long? Coumadin for 5 days  4. Name of physician performing surgery? Dr.Mann  5. What is your office phone and fax number? [phone]    Fax [phone]

## 2016-04-06 NOTE — Telephone Encounter (Signed)
Pt has a CHADS score of 2 so per protocol, okay to hold Coumadin.  He was last seen by Dr. Katrinka BlazingSmith in April 2016.  Will send to Dr. Katrinka BlazingSmith to make sure he is okay clearing patient given it has been >1 year since last visit.  If he is okay with clearance, will fax back to GI office.

## 2016-04-07 NOTE — Telephone Encounter (Signed)
I think okay to hold coumadin. We should call to be sure no new developments since last OV, such as embolic stroke.

## 2016-04-09 NOTE — Telephone Encounter (Signed)
Spoke with pt.  Verified he has not had any changes to his medical history, including no history of stroke.  He has actually rescheduled his colonoscopy.  Will go ahead and fax clearance to Dr. Kenna GilbertMann's office.

## 2016-07-30 DIAGNOSIS — B356 Tinea cruris: Secondary | ICD-10-CM | POA: Diagnosis not present

## 2016-07-30 DIAGNOSIS — Z23 Encounter for immunization: Secondary | ICD-10-CM | POA: Diagnosis not present

## 2016-08-16 ENCOUNTER — Telehealth: Payer: Self-pay | Admitting: Interventional Cardiology

## 2016-08-16 DIAGNOSIS — I10 Essential (primary) hypertension: Secondary | ICD-10-CM | POA: Diagnosis not present

## 2016-08-16 DIAGNOSIS — E789 Disorder of lipoprotein metabolism, unspecified: Secondary | ICD-10-CM | POA: Diagnosis not present

## 2016-08-16 DIAGNOSIS — E118 Type 2 diabetes mellitus with unspecified complications: Secondary | ICD-10-CM | POA: Diagnosis not present

## 2016-08-16 NOTE — Telephone Encounter (Signed)
New Message   Patient experiencing chest pain Per Pat @ Dr. Gorden HarmsKogue office. [facility]23/[facility] @2 :30pm.

## 2016-08-16 NOTE — Telephone Encounter (Signed)
Spoke with pt and he states while seeing PCP today he mentioned to them that he had an episode of CP a few days ago.  Episode did not last long and resolved on its own.  Pt states pain was more like a cramp.  EKG was done at Dr. Marylen PontoKohut's office.  Will call them to have that faxed over.  Denies any other symptoms and any other occurrences of pain.  No vitals available.  Pt was unaware of appt that had been scheduled on 08/22/16 and states that he has something else he has to do that day.  Pt states he will call back to make an appt once he is around his calendar. Advised pt if symptoms return and do not resolve or are worse he should go to ER for eval.  Will route to Dr. Katrinka BlazingSmith for review and advisement.  Spoke with Dennie BiblePat at Dr. Marylen PontoKohut's office and she states she is unsure if EKG was done.  She said she will have nurse fax it over if it was.  I asked that someone call our office and let us know if EKG was completed or not so we know whether to do one when pt comes to our office.

## 2016-08-16 NOTE — Telephone Encounter (Signed)
Agree with this approach. 

## 2016-08-17 DIAGNOSIS — E119 Type 2 diabetes mellitus without complications: Secondary | ICD-10-CM | POA: Diagnosis not present

## 2016-08-17 DIAGNOSIS — I10 Essential (primary) hypertension: Secondary | ICD-10-CM | POA: Diagnosis not present

## 2016-08-17 DIAGNOSIS — E789 Disorder of lipoprotein metabolism, unspecified: Secondary | ICD-10-CM | POA: Diagnosis not present

## 2016-08-17 DIAGNOSIS — Z7901 Long term (current) use of anticoagulants: Secondary | ICD-10-CM | POA: Diagnosis not present

## 2016-08-17 NOTE — Telephone Encounter (Signed)
Spoke with pt and scheduled him to see Nada BoozerLaura Ingold, NP on 08/23/16 at 1:30pm.  Called and spoke with Misty StanleyLisa in medical records at Dr. Marylen PontoKohut's office and asked that EKG be sent as yesterday I only received a medication list.  Provided fax number and Misty StanleyLisa said she would send it over.

## 2016-08-22 ENCOUNTER — Ambulatory Visit: Payer: Commercial Managed Care - HMO | Admitting: Nurse Practitioner

## 2016-08-23 ENCOUNTER — Ambulatory Visit: Payer: Commercial Managed Care - HMO | Admitting: Cardiology

## 2017-01-22 DIAGNOSIS — Z7901 Long term (current) use of anticoagulants: Secondary | ICD-10-CM | POA: Diagnosis not present

## 2017-01-22 DIAGNOSIS — E119 Type 2 diabetes mellitus without complications: Secondary | ICD-10-CM | POA: Diagnosis not present

## 2017-01-22 DIAGNOSIS — E789 Disorder of lipoprotein metabolism, unspecified: Secondary | ICD-10-CM | POA: Diagnosis not present

## 2017-01-29 DIAGNOSIS — E789 Disorder of lipoprotein metabolism, unspecified: Secondary | ICD-10-CM | POA: Diagnosis not present

## 2017-01-29 DIAGNOSIS — E118 Type 2 diabetes mellitus with unspecified complications: Secondary | ICD-10-CM | POA: Diagnosis not present

## 2017-02-14 DIAGNOSIS — Z7901 Long term (current) use of anticoagulants: Secondary | ICD-10-CM | POA: Diagnosis not present

## 2017-02-14 DIAGNOSIS — E119 Type 2 diabetes mellitus without complications: Secondary | ICD-10-CM | POA: Diagnosis not present

## 2017-02-14 DIAGNOSIS — Z125 Encounter for screening for malignant neoplasm of prostate: Secondary | ICD-10-CM | POA: Diagnosis not present

## 2017-02-14 DIAGNOSIS — E789 Disorder of lipoprotein metabolism, unspecified: Secondary | ICD-10-CM | POA: Diagnosis not present

## 2017-02-14 DIAGNOSIS — I482 Chronic atrial fibrillation: Secondary | ICD-10-CM | POA: Diagnosis not present

## 2017-02-14 DIAGNOSIS — I10 Essential (primary) hypertension: Secondary | ICD-10-CM | POA: Diagnosis not present

## 2017-02-21 DIAGNOSIS — E118 Type 2 diabetes mellitus with unspecified complications: Secondary | ICD-10-CM | POA: Diagnosis not present

## 2017-02-21 DIAGNOSIS — E789 Disorder of lipoprotein metabolism, unspecified: Secondary | ICD-10-CM | POA: Diagnosis not present

## 2017-03-05 ENCOUNTER — Ambulatory Visit (INDEPENDENT_AMBULATORY_CARE_PROVIDER_SITE_OTHER): Payer: Medicare HMO | Admitting: Interventional Cardiology

## 2017-03-05 ENCOUNTER — Encounter: Payer: Self-pay | Admitting: Interventional Cardiology

## 2017-03-05 VITALS — BP 132/80 | HR 63 | Ht 74.0 in | Wt 236.8 lb

## 2017-03-05 DIAGNOSIS — R600 Localized edema: Secondary | ICD-10-CM

## 2017-03-05 DIAGNOSIS — I5030 Unspecified diastolic (congestive) heart failure: Secondary | ICD-10-CM | POA: Diagnosis not present

## 2017-03-05 DIAGNOSIS — I482 Chronic atrial fibrillation, unspecified: Secondary | ICD-10-CM

## 2017-03-05 DIAGNOSIS — I10 Essential (primary) hypertension: Secondary | ICD-10-CM | POA: Diagnosis not present

## 2017-03-05 DIAGNOSIS — Z7901 Long term (current) use of anticoagulants: Secondary | ICD-10-CM

## 2017-03-05 NOTE — Patient Instructions (Signed)

## 2017-03-05 NOTE — Progress Notes (Signed)
Cardiology Office Note    Date:  03/05/2017   ID:  Manuel Turner, DOB 11/03/1945, MRN [id number]  PCP:  Darci NeedleKohut, Walter, MD  Cardiologist: Lesleigh NoeHenry W Leib Elahi III, MD   Chief Complaint  Patient presents with  . Atrial Fibrillation    History of Present Illness:  Manuel Turner is a 71 y.o. male  for chronic atrial fibrillation, chronic anticoagulation, HFrEF improved, and hypertension.  Doing okay. Does suffer a little more fatigue and exertional dyspnea with morning his grass all in one session. He denies orthopnea, PND, ankle edema, and chest pain. He has not had syncope. Is no significant palpitation history.  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 In retrospect it is possible that the "myocarditis" was related to atrial fibrillation with rapid rate and systolic dysfunction then improved with rate control.  Past Medical History:  Diagnosis Date  . Atrial fibrillation (HCC)   . Diabetes mellitus without complication (HCC)   . Hyperlipemia   . Hypertension     No past surgical history on file.  Current Medications: Outpatient Medications Prior to Visit  Medication Sig Dispense Refill  . atorvastatin (LIPITOR) 20 MG tablet Take 20 mg by mouth daily.    Marland Kitchen. CINNAMON PO Take 1,000 mg by mouth.    . digoxin (LANOXIN) 0.125 MG tablet Take 0.375 mg by mouth daily.    Marland Kitchen. eplerenone (INSPRA) 25 MG tablet Take 25 mg by mouth daily.    Marland Kitchen. losartan (COZAAR) 100 MG tablet Take 100 mg by mouth daily.    . metFORMIN (GLUCOPHAGE) 1000 MG tablet Take 1,000 mg by mouth 2 (two) times daily with a meal.    . sitaGLIPtin (JANUVIA) 100 MG tablet Take 100 mg by mouth daily.    Marland Kitchen. warfarin (COUMADIN) 4 MG tablet Take 4 mg by mouth daily. Pt. Taking 4 mg by mouth every other day and 5 mg the next following day     No facility-administered medications prior to visit.      Allergies:   Patient has no known allergies.   Social History     Social History  . Marital status: Married    Spouse name: N/A  . Number of children: N/A  . Years of education: N/A   Social History Main Topics  . Smoking status: Former Games developermoker  . Smokeless tobacco: Never Used  . Alcohol use No  . Drug use: Unknown  . Sexual activity: Not Asked   Other Topics Concern  . None   Social History Narrative  . None     Family History:  The patient's Family history is unknown by patient.   ROS:   Please see the history of present illness.    No complaints. Appetite is stable.  All other systems reviewed and are negative.   PHYSICAL EXAM:   VS:  BP 132/80 (BP Location: Left Arm)   Pulse 63   Ht 6\' 2"  (1.88 m)   Wt 236 lb 12.8 oz (107.4 kg)   BMI 30.40 kg/m    GEN: Well nourished, well developed, in no acute distress  HEENT: normal  Neck: no JVD, carotid bruits, or masses Cardiac: IIRR; no murmurs, rubs, or gallops,no edema  Respiratory:  clear to auscultation bilaterally, normal work of breathing GI: soft, nontender, nondistended, + BS MS: no deformity or atrophy  Skin: warm and dry, no rash Neuro:  Alert and Oriented x 3, Strength and  sensation are intact Psych: euthymic mood, full affect  Wt Readings from Last 3 Encounters:  03/05/17 236 lb 12.8 oz (107.4 kg)  12/07/14 230 lb 12.8 oz (104.7 kg)  03/16/14 247 lb (112 kg)      Studies/Labs Reviewed:   EKG:  EKG  AF with controlled rate. No change noted.  Recent Labs: No results found for requested labs within last 8760 hours.   Lipid Panel No results found for: CHOL, TRIG, HDL, CHOLHDL, VLDL, LDLCALC, LDLDIRECT  Additional studies/ records that were reviewed today include:  Last echocardiogram 2011 when EF was 55%.    ASSESSMENT:    1. Atrial fibrillation, chronic (HCC)   2. Essential hypertension   3. (HFpEF) heart failure with preserved ejection fraction (HCC)   4. Chronic anticoagulation   5. Bilateral lower extremity edema      PLAN:  In order of  problems listed above:  1. Chronic atrial fibrillation with good rate control. Asymptomatic. 2. Excellent blood pressure control. Blood pressure 140/90 or less as the target. 3. No evidence of volume overload. 4. Chronic Coumadin therapy without bleeding. 5. Not currently present.  Clinical follow-up in one year. Consider switching to NOAC therapy.    Medication Adjustments/Labs and Tests Ordered: Current medicines are reviewed at length with the patient today.  Concerns regarding medicines are outlined above.  Medication changes, Labs and Tests ordered today are listed in the Patient Instructions below. There are no Patient Instructions on file for this visit.   Signed, Lesleigh Noe, MD  03/05/2017 4:11 PM    [facility] [address] [place], [place], [place]  [address] Phone: [phone]; Fax: [phone]

## 2017-03-14 DIAGNOSIS — K5730 Diverticulosis of large intestine without perforation or abscess without bleeding: Secondary | ICD-10-CM | POA: Diagnosis not present

## 2017-03-14 DIAGNOSIS — Z86010 Personal history of colonic polyps: Secondary | ICD-10-CM | POA: Diagnosis not present

## 2017-03-14 DIAGNOSIS — Z1211 Encounter for screening for malignant neoplasm of colon: Secondary | ICD-10-CM | POA: Diagnosis not present

## 2017-03-14 DIAGNOSIS — Z800 Family history of malignant neoplasm of digestive organs: Secondary | ICD-10-CM | POA: Diagnosis not present

## 2017-03-21 ENCOUNTER — Telehealth: Payer: Self-pay

## 2017-03-21 NOTE — Telephone Encounter (Signed)
Clearance [facility] to hold [facility]madin [facility]. Patient is scheduled for a colonoscopy on 04/10/17.  [facility] and spoke with Morrie Sheldonshley advised her that we do not manage the patients coumadin, they will need to f/u with the pt to find out where he gets his pt/inr checks and fwd the rqst to them. Morrie Sheldonshley verbalized understanding.

## 2017-04-05 ENCOUNTER — Telehealth: Payer: Self-pay | Admitting: Interventional Cardiology

## 2017-04-05 NOTE — Telephone Encounter (Signed)
New emssage         [facility] Pre-operative Risk Assessment    Request for surgical clearance:  1. What type of surgery is being performed?  colonoscopy  2. When is this surgery scheduled? 04-10-17  Are there any medications that need to be held prior to surgery and how long? Cardiac clearance only for anesthesia.  Have clearance for coumadin 3. Name of physician performing surgery?  Dr Loreta Ave  4. What is your office phone and fax number?  Fax [phone]  Manuel Turner 04/05/2017, 3:01 PM  _________________________________________________________________   (provider comments below)

## 2017-04-05 NOTE — Telephone Encounter (Signed)
He has coumadin therapy to prevent stroke. Okay to hold coumadin 3-5 days prior to colonoscopy. Resume therapy and coordinate f/u with whoever manages his coumadin.

## 2017-04-08 ENCOUNTER — Telehealth: Payer: Self-pay | Admitting: *Deleted

## 2017-04-08 NOTE — Telephone Encounter (Signed)
-----   Message from Orvis Brill sent at 04/08/2017  2:29 PM EDT ----- Regarding: Clearance for Anesthesia Constance Holster, LPN,  Sherley Bounds, CRNA at [facility] is requesting cardiac clearance for the Anesthesia for the colonoscopy that is scheduled 04/10/2017. Dr. Selena Batten already gave clearance for the Coumadin. Thank you for your assistance.

## 2017-04-08 NOTE — Telephone Encounter (Signed)
Cleared for upcoming endoscopy.

## 2017-04-08 NOTE — Telephone Encounter (Signed)
Will route to Dr. Katrinka Blazing for cardiac clearance on colonoscopy.  They already have instructions for Coumadin, just need cardiac clearance.

## 2017-04-08 NOTE — Telephone Encounter (Signed)
Faxed to requesting office. 

## 2017-04-08 NOTE — Telephone Encounter (Signed)
Faxed to office

## 2017-08-23 DIAGNOSIS — Z125 Encounter for screening for malignant neoplasm of prostate: Secondary | ICD-10-CM | POA: Diagnosis not present

## 2017-08-23 DIAGNOSIS — E118 Type 2 diabetes mellitus with unspecified complications: Secondary | ICD-10-CM | POA: Diagnosis not present

## 2017-08-23 DIAGNOSIS — E119 Type 2 diabetes mellitus without complications: Secondary | ICD-10-CM | POA: Diagnosis not present

## 2017-08-23 DIAGNOSIS — E789 Disorder of lipoprotein metabolism, unspecified: Secondary | ICD-10-CM | POA: Diagnosis not present

## 2017-08-23 DIAGNOSIS — I10 Essential (primary) hypertension: Secondary | ICD-10-CM | POA: Diagnosis not present

## 2017-08-29 DIAGNOSIS — E119 Type 2 diabetes mellitus without complications: Secondary | ICD-10-CM | POA: Diagnosis not present

## 2017-08-29 DIAGNOSIS — E789 Disorder of lipoprotein metabolism, unspecified: Secondary | ICD-10-CM | POA: Diagnosis not present

## 2017-08-29 DIAGNOSIS — I482 Chronic atrial fibrillation: Secondary | ICD-10-CM | POA: Diagnosis not present

## 2017-09-03 DIAGNOSIS — I482 Chronic atrial fibrillation: Secondary | ICD-10-CM | POA: Diagnosis not present

## 2017-09-03 DIAGNOSIS — Z7901 Long term (current) use of anticoagulants: Secondary | ICD-10-CM | POA: Diagnosis not present

## 2017-11-25 DIAGNOSIS — E119 Type 2 diabetes mellitus without complications: Secondary | ICD-10-CM | POA: Diagnosis not present

## 2017-11-25 DIAGNOSIS — I482 Chronic atrial fibrillation: Secondary | ICD-10-CM | POA: Diagnosis not present

## 2017-11-28 ENCOUNTER — Ambulatory Visit (INDEPENDENT_AMBULATORY_CARE_PROVIDER_SITE_OTHER): Payer: Medicare HMO | Admitting: Orthopedic Surgery

## 2017-11-28 ENCOUNTER — Encounter (INDEPENDENT_AMBULATORY_CARE_PROVIDER_SITE_OTHER): Payer: Self-pay | Admitting: Orthopedic Surgery

## 2017-11-28 DIAGNOSIS — M7541 Impingement syndrome of right shoulder: Secondary | ICD-10-CM | POA: Diagnosis not present

## 2017-11-28 MED ORDER — BUPIVACAINE HCL 0.5 % IJ SOLN
9.0000 mL | INTRAMUSCULAR | Status: AC | PRN
  Administered 2017-11-28: 9 mL via INTRA_ARTICULAR

## 2017-11-28 MED ORDER — METHYLPREDNISOLONE ACETATE 40 MG/ML IJ SUSP
40.0000 mg | INTRAMUSCULAR | Status: AC | PRN
  Administered 2017-11-28: 40 mg via INTRA_ARTICULAR

## 2017-11-28 MED ORDER — LIDOCAINE HCL 1 % IJ SOLN
5.0000 mL | INTRAMUSCULAR | Status: AC | PRN
  Administered 2017-11-28: 5 mL

## 2017-11-28 NOTE — Progress Notes (Signed)
Office Visit Note   Patient: Manuel Turner           Date of Birth: 24-Dec-1945           MRN: [id number] Visit Date: 11/28/2017 Requested by: Darci Needle, MD [address] [place], [place] [address] PCP: Darci Needle, MD  Subjective: Chief Complaint  Patient presents with  . Right Shoulder - Pain  . Right Elbow - Pain    HPI: Manuel Turner is a patient with right arm pain.  Started after doing yard work.  This happened this weekend.  Denies any discrete history of injury.  Previous treatment for the same symptoms with cortisone injection about 5 years ago and did well.  He worked as a Music therapist for 25 years.  He is right-hand dominant.  This pain does wake him from sleep at night.  Denies any numbness and tingling or neck pain but does report radiation to the elbow.  He takes naproxen for the pain.              ROS: All systems reviewed are negative as they relate to the chief complaint within the history of present illness.  Patient denies  fevers or chills.   Assessment & Plan: Visit Diagnoses:  1. Impingement syndrome of right shoulder     Plan: Impression is right shoulder impingement with good rotator cuff strength on manual motor testing.  Plan is subacromial injection with return office visit if his symptoms do not improve.  Could consider further imaging at that time but there is no evidence of rotator cuff weakness or frozen shoulder today.  Does have some equivocal impingement signs.  Come back as needed for further evaluation workup  Follow-Up Instructions: Return if symptoms worsen or fail to improve.   Orders:  No orders of the defined types were placed in this encounter.  No orders of the defined types were placed in this encounter.     Procedures: Large Joint Inj: R subacromial bursa on 11/28/2017 11:56 AM Indications: diagnostic evaluation and pain Details: 18 G 1.5 in needle, posterior approach  Arthrogram: No  Medications: 9 mL bupivacaine 0.5  %; 40 mg methylPREDNISolone acetate 40 MG/ML; 5 mL lidocaine 1 % Outcome: tolerated well, no immediate complications Procedure, treatment alternatives, risks and benefits explained, specific risks discussed. Consent was given by the patient. Immediately prior to procedure a time out was called to verify the correct patient, procedure, equipment, support staff and site/side marked as required. Patient was prepped and draped in the usual sterile fashion.       Clinical Data: No additional findings.  Objective: Vital Signs: There were no vitals taken for this visit.  Physical Exam:   Constitutional: Patient appears well-developed HEENT:  Head: Normocephalic Eyes:EOM are normal Neck: Normal range of motion Cardiovascular: Normal rate Pulmonary/chest: Effort normal Neurologic: Patient is alert Skin: Skin is warm Psychiatric: Patient has normal mood and affect    Ortho Exam: Orthopedic exam demonstrates good cervical spine range of motion.  5 out of 5 grip EPL FPL interosseous wrist flexion wrist extension biceps triceps and deltoid strength.  No real coarse grinding or crepitus in the right shoulder region with good rotator cuff strength testing to infraspinatus supraspinatus and subscap motor testing.  Impingement signs are equivocal on the right negative on the left.  No discrete AC joint tenderness is present.  Negative O'Brien's testing on the right left-hand side.  No other masses lymphadenopathy or skin changes noted in that shoulder  girdle region  Specialty Comments:  No specialty comments available.  Imaging: No results found.   PMFS History: Patient Active Problem List   Diagnosis Date Noted  . (HFpEF) heart failure with preserved ejection fraction (HCC) 12/07/2014  . Atrial fibrillation, chronic (HCC) 03/16/2014  . Essential hypertension 03/16/2014  . Chronic anticoagulation 03/16/2014  . Bilateral lower extremity edema 03/16/2014   Past Medical History:    Diagnosis Date  . Atrial fibrillation (HCC)   . Diabetes mellitus without complication (HCC)   . Hyperlipemia   . Hypertension     Family History  Family history unknown: Yes    History reviewed. No pertinent surgical history. Social History   Occupational History  . Not on file  Tobacco Use  . Smoking status: Former Games developermoker  . Smokeless tobacco: Never Used  Substance and Sexual Activity  . Alcohol use: No  . Drug use: Not on file  . Sexual activity: Not on file

## 2017-12-02 DIAGNOSIS — E119 Type 2 diabetes mellitus without complications: Secondary | ICD-10-CM | POA: Diagnosis not present

## 2017-12-02 DIAGNOSIS — E789 Disorder of lipoprotein metabolism, unspecified: Secondary | ICD-10-CM | POA: Diagnosis not present

## 2017-12-02 DIAGNOSIS — I480 Paroxysmal atrial fibrillation: Secondary | ICD-10-CM | POA: Diagnosis not present

## 2017-12-02 DIAGNOSIS — Z7901 Long term (current) use of anticoagulants: Secondary | ICD-10-CM | POA: Diagnosis not present

## 2017-12-02 DIAGNOSIS — Z79899 Other long term (current) drug therapy: Secondary | ICD-10-CM | POA: Diagnosis not present

## 2017-12-02 DIAGNOSIS — I10 Essential (primary) hypertension: Secondary | ICD-10-CM | POA: Diagnosis not present

## 2017-12-02 DIAGNOSIS — R82998 Other abnormal findings in urine: Secondary | ICD-10-CM | POA: Diagnosis not present

## 2017-12-09 DIAGNOSIS — I482 Chronic atrial fibrillation: Secondary | ICD-10-CM | POA: Diagnosis not present

## 2017-12-09 DIAGNOSIS — F419 Anxiety disorder, unspecified: Secondary | ICD-10-CM | POA: Diagnosis not present

## 2017-12-09 DIAGNOSIS — E119 Type 2 diabetes mellitus without complications: Secondary | ICD-10-CM | POA: Diagnosis not present

## 2017-12-09 DIAGNOSIS — Z7901 Long term (current) use of anticoagulants: Secondary | ICD-10-CM | POA: Diagnosis not present

## 2017-12-09 DIAGNOSIS — Z79899 Other long term (current) drug therapy: Secondary | ICD-10-CM | POA: Diagnosis not present

## 2017-12-09 DIAGNOSIS — I10 Essential (primary) hypertension: Secondary | ICD-10-CM | POA: Diagnosis not present

## 2017-12-17 DIAGNOSIS — I482 Chronic atrial fibrillation: Secondary | ICD-10-CM | POA: Diagnosis not present

## 2017-12-17 DIAGNOSIS — Z7901 Long term (current) use of anticoagulants: Secondary | ICD-10-CM | POA: Diagnosis not present

## 2018-01-02 DIAGNOSIS — I480 Paroxysmal atrial fibrillation: Secondary | ICD-10-CM | POA: Diagnosis not present

## 2018-01-02 DIAGNOSIS — Z7901 Long term (current) use of anticoagulants: Secondary | ICD-10-CM | POA: Diagnosis not present

## 2018-03-05 NOTE — Progress Notes (Signed)
Cardiology Office Note    Date:  03/06/2018   ID:  Manuel NightKenneth W Oubre, DOB 02/23/1946, MRN [id number]  PCP:  Pearson GrippeKim, James, MD  Cardiologist: Lesleigh NoeHenry W Avelynn Sellin III, MD   Chief Complaint  Patient presents with  . Atrial Fibrillation    History of Present Illness:  Manuel Turner is a 72 y.o. male for chronic atrial fibrillation, chronic anticoagulation, HFrEF improved, and hypertension.  On relatively high dose of digoxin for rate control.  Treatment regimen started years ago by Dr. [place]ie [place].   He is doing well.  He has not had syncope.  Recent medication adjustments by Dr. Selena BattenKim.  Carvedilol was added.  He feels somewhat sluggish.  He denies dyspnea.  No edema or orthopnea.  Past Medical History:  Diagnosis Date  . Atrial fibrillation (HCC)   . Diabetes mellitus without complication (HCC)   . Hyperlipemia   . Hypertension     History reviewed. No pertinent surgical history.  Current Medications: Outpatient Medications Prior to Visit  Medication Sig Dispense Refill  . atorvastatin (LIPITOR) 20 MG tablet Take 20 mg by mouth daily.    . carvedilol (COREG) 6.25 MG tablet Take 6.25 mg by mouth 2 (two) times daily with a meal.    . CINNAMON PO Take 1,000 mg by mouth.    . diazepam (VALIUM) 5 MG tablet Take 2.5-5 mg by mouth daily as needed for anxiety.    . digoxin (LANOXIN) 0.125 MG tablet Take 0.375 mg by mouth daily.    Marland Kitchen. eplerenone (INSPRA) 25 MG tablet Take 25 mg by mouth daily.    Marland Kitchen. losartan (COZAAR) 100 MG tablet Take 100 mg by mouth daily.    . metFORMIN (GLUCOPHAGE) 1000 MG tablet Take 1,000 mg by mouth daily with breakfast.     . sitaGLIPtin (JANUVIA) 100 MG tablet Take 100 mg by mouth daily.    Marland Kitchen. warfarin (COUMADIN) 4 MG tablet Take 4 mg by mouth daily.      No facility-administered medications prior to visit.      Allergies:   Patient has no known allergies.   Social History   Socioeconomic History  . Marital status: Married    Spouse name: Not on file  . Number of  children: Not on file  . Years of education: Not on file  . Highest education level: Not on file  Occupational History  . Not on file  Social Needs  . Financial resource strain: Not on file  . Food insecurity:    Worry: Not on file    Inability: Not on file  . Transportation needs:    Medical: Not on file    Non-medical: Not on file  Tobacco Use  . Smoking status: Former Games developermoker  . Smokeless tobacco: Never Used  Substance and Sexual Activity  . Alcohol use: No  . Drug use: Not on file  . Sexual activity: Not on file  Lifestyle  . Physical activity:    Days per week: Not on file    Minutes per session: Not on file  . Stress: Not on file  Relationships  . Social connections:    Talks on phone: Not on file    Gets together: Not on file    Attends religious service: Not on file    Active member of club or organization: Not on file    Attends meetings of clubs or organizations: Not on file    Relationship status: Not on file  Other Topics Concern  .  Not on file  Social History Narrative  . Not on file     Family History:  The patient's Family history is unknown by patient.   ROS:   Please see the history of present illness.    Recent fertility. All other systems reviewed and are negative.   PHYSICAL EXAM:   VS:  BP (!) 146/82   Pulse (!) 58   Ht 6\' 2"  (1.88 m)   Wt 241 lb 6.4 oz (109.5 kg)   BMI 30.99 kg/m    GEN: Well nourished, well developed, in no acute distress  HEENT: normal  Neck: no JVD, carotid bruits, or masses Cardiac: IIRR; no murmurs, rubs, or gallops,no edema  Respiratory:  clear to auscultation bilaterally, normal work of breathing GI: soft, nontender, nondistended, + BS MS: no deformity or atrophy  Skin: warm and dry, no rash Neuro:  Alert and Oriented x 3, Strength and sensation are intact Psych: euthymic mood, full affect  Wt Readings from Last 3 Encounters:  03/06/18 241 lb 6.4 oz (109.5 kg)  03/05/17 236 lb 12.8 oz (107.4 kg)  12/07/14  230 lb 12.8 oz (104.7 kg)      Studies/Labs Reviewed:   EKG:  EKG atrial fibrillation, slow ventricular response at 58 bpm, leftward axis.  When compared to the prior tracing performed in July 2018, no significant change has occurred.  The ventricular response is slightly faster on today's exam.  Recent Labs: No results found for requested labs within last 8760 hours.   Lipid Panel No results found for: CHOL, TRIG, HDL, CHOLHDL, VLDL, LDLCALC, LDLDIRECT  Additional studies/ records that were reviewed today include:  No new data.    ASSESSMENT:    1. Atrial fibrillation, chronic (HCC)   2. Essential hypertension   3. Chronic heart failure with preserved ejection fraction (HCC)   4. Chronic anticoagulation   5. Bilateral lower extremity edema      PLAN:  In order of problems listed above:  1. Reason with the addition of carvedilol for blood pressure control has slowed response to atrial fibrillation.  Decrease digoxin 2.25 mg daily and consider further downward titration.  Dig level in 1 week at Dr. Elmyra Ricks office. 2. Target blood pressure less than 140/90 mmHg.  He will monitor blood pressure at home and call us results the next 2 to 3 weeks.  May need further up titration of therapy perhaps with carvedilol further decreasing digoxin requirement. 3. No heart failure symptoms currently. 4. Coumadin clinic at [facility] 5. Not currently present.   Clinical follow-up in 1 year.  Dig level on reduced dose digoxin 0.25 mg/day.  If further up titration and carvedilol, further reduction in digoxin will be required.  Blood pressure target should be 140/90 mmHg or less.  Further uptitrate carvedilol as needed and tolerated by the patient.    Medication Adjustments/Labs and Tests Ordered: Current medicines are reviewed at length with the patient today.  Concerns regarding medicines are outlined above.  Medication changes, Labs and Tests ordered today are listed in  the Patient Instructions below. There are no Patient Instructions on file for this visit.   Signed, Lesleigh Noe, MD  03/06/2018 10:29 AM    [facility] [address] [place], [place], [place]  [address] Phone: [phone]; Fax: [phone]

## 2018-03-06 ENCOUNTER — Encounter: Payer: Self-pay | Admitting: Interventional Cardiology

## 2018-03-06 ENCOUNTER — Ambulatory Visit: Payer: Medicare HMO | Admitting: Interventional Cardiology

## 2018-03-06 VITALS — BP 146/82 | HR 58 | Ht 74.0 in | Wt 241.4 lb

## 2018-03-06 DIAGNOSIS — Z7901 Long term (current) use of anticoagulants: Secondary | ICD-10-CM | POA: Diagnosis not present

## 2018-03-06 DIAGNOSIS — I10 Essential (primary) hypertension: Secondary | ICD-10-CM

## 2018-03-06 DIAGNOSIS — I482 Chronic atrial fibrillation, unspecified: Secondary | ICD-10-CM

## 2018-03-06 DIAGNOSIS — I5032 Chronic diastolic (congestive) heart failure: Secondary | ICD-10-CM | POA: Diagnosis not present

## 2018-03-06 DIAGNOSIS — R600 Localized edema: Secondary | ICD-10-CM

## 2018-03-06 MED ORDER — DIGOXIN 125 MCG PO TABS: 0.2500 mg | ORAL_TABLET | Freq: Every day | ORAL | 3 refills | Status: DC

## 2018-03-06 NOTE — Patient Instructions (Signed)
Medication Instructions:  1) DECREASE Digoxin to 0.125mg - take two tablets once daily  Labwork: Please take provided prescription with you to your upcoming doctor's appointment and have them draw a Digoxin level  Testing/Procedures: None  Follow-Up: Your physician wants you to follow-up in: 1 year with Dr. Katrinka BlazingSmith.  You will receive a reminder letter in the mail two months in advance. If you don't receive a letter, please call our office to schedule the follow-up appointment.   Any Other Special Instructions Will Be Listed Below (If Applicable).  Please monitor your blood pressure at least twice a week , 2 hours after medications and contact the office with these readings in a couple of weeks.    If you need a refill on your cardiac medications before your next appointment, please call your pharmacy.

## 2018-03-12 DIAGNOSIS — Z5181 Encounter for therapeutic drug level monitoring: Secondary | ICD-10-CM | POA: Diagnosis not present

## 2018-03-12 DIAGNOSIS — I482 Chronic atrial fibrillation: Secondary | ICD-10-CM | POA: Diagnosis not present

## 2018-03-12 DIAGNOSIS — E119 Type 2 diabetes mellitus without complications: Secondary | ICD-10-CM | POA: Diagnosis not present

## 2018-03-12 DIAGNOSIS — Z79899 Other long term (current) drug therapy: Secondary | ICD-10-CM | POA: Diagnosis not present

## 2018-03-12 DIAGNOSIS — I10 Essential (primary) hypertension: Secondary | ICD-10-CM | POA: Diagnosis not present

## 2018-03-18 DIAGNOSIS — I482 Chronic atrial fibrillation: Secondary | ICD-10-CM | POA: Diagnosis not present

## 2018-03-18 DIAGNOSIS — E789 Disorder of lipoprotein metabolism, unspecified: Secondary | ICD-10-CM | POA: Diagnosis not present

## 2018-03-18 DIAGNOSIS — Z79899 Other long term (current) drug therapy: Secondary | ICD-10-CM | POA: Diagnosis not present

## 2018-03-18 DIAGNOSIS — I10 Essential (primary) hypertension: Secondary | ICD-10-CM | POA: Diagnosis not present

## 2018-03-18 DIAGNOSIS — Z7901 Long term (current) use of anticoagulants: Secondary | ICD-10-CM | POA: Diagnosis not present

## 2018-03-18 DIAGNOSIS — E119 Type 2 diabetes mellitus without complications: Secondary | ICD-10-CM | POA: Diagnosis not present

## 2018-04-09 DIAGNOSIS — E118 Type 2 diabetes mellitus with unspecified complications: Secondary | ICD-10-CM | POA: Diagnosis not present

## 2018-04-09 DIAGNOSIS — I482 Chronic atrial fibrillation: Secondary | ICD-10-CM | POA: Diagnosis not present

## 2018-04-09 DIAGNOSIS — Z5181 Encounter for therapeutic drug level monitoring: Secondary | ICD-10-CM | POA: Diagnosis not present

## 2018-04-09 DIAGNOSIS — E119 Type 2 diabetes mellitus without complications: Secondary | ICD-10-CM | POA: Diagnosis not present

## 2018-04-09 DIAGNOSIS — Z79899 Other long term (current) drug therapy: Secondary | ICD-10-CM | POA: Diagnosis not present

## 2018-04-15 DIAGNOSIS — E118 Type 2 diabetes mellitus with unspecified complications: Secondary | ICD-10-CM | POA: Diagnosis not present

## 2018-04-15 DIAGNOSIS — I480 Paroxysmal atrial fibrillation: Secondary | ICD-10-CM | POA: Diagnosis not present

## 2018-04-15 DIAGNOSIS — Z7901 Long term (current) use of anticoagulants: Secondary | ICD-10-CM | POA: Diagnosis not present

## 2018-04-15 DIAGNOSIS — I10 Essential (primary) hypertension: Secondary | ICD-10-CM | POA: Diagnosis not present

## 2018-04-16 DIAGNOSIS — I10 Essential (primary) hypertension: Secondary | ICD-10-CM | POA: Diagnosis not present

## 2018-04-16 DIAGNOSIS — I482 Chronic atrial fibrillation: Secondary | ICD-10-CM | POA: Diagnosis not present

## 2018-04-16 DIAGNOSIS — Z7901 Long term (current) use of anticoagulants: Secondary | ICD-10-CM | POA: Diagnosis not present

## 2018-04-16 DIAGNOSIS — E119 Type 2 diabetes mellitus without complications: Secondary | ICD-10-CM | POA: Diagnosis not present

## 2018-05-20 DIAGNOSIS — Z7901 Long term (current) use of anticoagulants: Secondary | ICD-10-CM | POA: Diagnosis not present

## 2018-05-20 DIAGNOSIS — E119 Type 2 diabetes mellitus without complications: Secondary | ICD-10-CM | POA: Diagnosis not present

## 2018-05-20 DIAGNOSIS — I482 Chronic atrial fibrillation, unspecified: Secondary | ICD-10-CM | POA: Diagnosis not present

## 2018-06-09 DIAGNOSIS — E118 Type 2 diabetes mellitus with unspecified complications: Secondary | ICD-10-CM | POA: Diagnosis not present

## 2018-06-09 DIAGNOSIS — I10 Essential (primary) hypertension: Secondary | ICD-10-CM | POA: Diagnosis not present

## 2018-06-09 DIAGNOSIS — Z7901 Long term (current) use of anticoagulants: Secondary | ICD-10-CM | POA: Diagnosis not present

## 2018-06-17 DIAGNOSIS — M25511 Pain in right shoulder: Secondary | ICD-10-CM | POA: Diagnosis not present

## 2018-06-17 DIAGNOSIS — I482 Chronic atrial fibrillation, unspecified: Secondary | ICD-10-CM | POA: Diagnosis not present

## 2018-06-17 DIAGNOSIS — E119 Type 2 diabetes mellitus without complications: Secondary | ICD-10-CM | POA: Diagnosis not present

## 2018-06-17 DIAGNOSIS — I10 Essential (primary) hypertension: Secondary | ICD-10-CM | POA: Diagnosis not present

## 2018-06-17 DIAGNOSIS — I509 Heart failure, unspecified: Secondary | ICD-10-CM | POA: Diagnosis not present

## 2018-06-17 DIAGNOSIS — Z7901 Long term (current) use of anticoagulants: Secondary | ICD-10-CM | POA: Diagnosis not present

## 2018-07-07 DIAGNOSIS — E119 Type 2 diabetes mellitus without complications: Secondary | ICD-10-CM | POA: Diagnosis not present

## 2018-07-07 DIAGNOSIS — I10 Essential (primary) hypertension: Secondary | ICD-10-CM | POA: Diagnosis not present

## 2018-07-07 DIAGNOSIS — Z7901 Long term (current) use of anticoagulants: Secondary | ICD-10-CM | POA: Diagnosis not present

## 2018-07-07 DIAGNOSIS — I482 Chronic atrial fibrillation, unspecified: Secondary | ICD-10-CM | POA: Diagnosis not present

## 2018-07-28 DIAGNOSIS — Z7901 Long term (current) use of anticoagulants: Secondary | ICD-10-CM | POA: Diagnosis not present

## 2018-07-28 DIAGNOSIS — I10 Essential (primary) hypertension: Secondary | ICD-10-CM | POA: Diagnosis not present

## 2018-07-28 DIAGNOSIS — E119 Type 2 diabetes mellitus without complications: Secondary | ICD-10-CM | POA: Diagnosis not present

## 2018-07-28 DIAGNOSIS — I480 Paroxysmal atrial fibrillation: Secondary | ICD-10-CM | POA: Diagnosis not present

## 2018-08-18 DIAGNOSIS — Z7901 Long term (current) use of anticoagulants: Secondary | ICD-10-CM | POA: Diagnosis not present

## 2018-08-18 DIAGNOSIS — I482 Chronic atrial fibrillation, unspecified: Secondary | ICD-10-CM | POA: Diagnosis not present

## 2018-08-18 DIAGNOSIS — I10 Essential (primary) hypertension: Secondary | ICD-10-CM | POA: Diagnosis not present

## 2018-09-15 DIAGNOSIS — I482 Chronic atrial fibrillation, unspecified: Secondary | ICD-10-CM | POA: Diagnosis not present

## 2018-09-15 DIAGNOSIS — Z7901 Long term (current) use of anticoagulants: Secondary | ICD-10-CM | POA: Diagnosis not present

## 2018-10-13 DIAGNOSIS — Z7901 Long term (current) use of anticoagulants: Secondary | ICD-10-CM | POA: Diagnosis not present

## 2018-10-13 DIAGNOSIS — I480 Paroxysmal atrial fibrillation: Secondary | ICD-10-CM | POA: Diagnosis not present

## 2018-10-13 DIAGNOSIS — I10 Essential (primary) hypertension: Secondary | ICD-10-CM | POA: Diagnosis not present

## 2018-10-21 ENCOUNTER — Encounter (INDEPENDENT_AMBULATORY_CARE_PROVIDER_SITE_OTHER): Payer: Self-pay | Admitting: Family Medicine

## 2018-10-21 ENCOUNTER — Ambulatory Visit (INDEPENDENT_AMBULATORY_CARE_PROVIDER_SITE_OTHER): Payer: Medicare HMO | Admitting: Family Medicine

## 2018-10-21 DIAGNOSIS — M25562 Pain in left knee: Secondary | ICD-10-CM | POA: Diagnosis not present

## 2018-10-21 DIAGNOSIS — M25552 Pain in left hip: Secondary | ICD-10-CM | POA: Diagnosis not present

## 2018-10-21 MED ORDER — METHYLPREDNISOLONE ACETATE 40 MG/ML IJ SUSP: 40.0000 mg | Freq: Once | INTRAMUSCULAR | Status: DC

## 2018-10-21 NOTE — Progress Notes (Signed)
I saw and examined the patient with Dr. Jamse Mead and agree with assessment and plan as outlined.  Injected left greater trochanter and left knee intraarticularly today.  Home exercises given.  PT if pain persists.

## 2018-10-21 NOTE — Progress Notes (Signed)
  Manuel Turner - 73 y.o. male MRN [id number]  Date of birth: [birth year]    SUBJECTIVE:      Chief Complaint: left hip and knee pain  HPI:  73 year old male with left hip and left knee pain.  Patient reports intermittent symptoms for the past several months.  Made worse yesterday.  He localizes pain to primarily the lateral hip and the lateral knee.  His pain is made worse with prolonged walking or standing.  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  This exacerbated his pain in both the hip and knee.  He uses meloxicam and tizanidine as needed which do help. He does note occasional groin pain but states the lateral hip pain is worse. No swelling, erythema or bruising. No locking or giving out of either joint. No numbness or tingling in the distal LE. No skin changes   ROS:     See HPI  PERTINENT  PMH / PSH FH / / SH:  Past Medical, Surgical, Social, and Family History Reviewed & Updated in the EMR.    OBJECTIVE: There were no vitals taken for this visit.  Physical Exam:  Vital signs are reviewed.  GEN: Alert and oriented, NAD Pulm: Breathing unlabored PSY: normal mood, congruent affect  MSK: Left Hip:  - Inspection: No gross deformity, no swelling, erythema, or ecchymosis - Palpation: TTP over the GT - ROM: limited IR, no pain with passive ROM testing - Strength: Normal strength. - Neuro/vasc: NV intact distally - Special Tests: Negative FABER. Pain with FADIR both anteriorly and laterally.  Negative logroll.  Positive Trendelenberg.   Right Hip:  - Inspection: No obvious deformity - Palpation: No TTP over greater trochanter - ROM: limited IR without pain - Strength: Normal strength. - Neuro/vasc: NV intact distally  Left Knee: - Inspection: no gross deformity. No swelling/effusion, erythema or bruising. Skin intact - Palpation: TTP lateral joint line - ROM: full active ROM with flexion and extension in knee and hip - Strength: 5/5  strength - Neuro/vasc: NV intact - Special Tests: - LIGAMENTS: negative Lachman's, no MCL or LCL laxity  -- MENISCUS: negative McMurray's     ASSESSMENT & PLAN:  1. Left lateral hip pain - 2/2 GT pain syndrome, although there are some milder symptoms suggestive of hip OA - steroid injection performed today - home strengthening exercises - f/u as needed   2. Left knee pain likely 2/2 OA. Knee is stable on exam without effusion - steroid injection performed today - f/u as needed    Procedure performed: knee intraarticular corticosteroid injection; palpation guided Consent obtained and verified. Time-out conducted. Noted no overlying erythema, induration, or other signs of local infection. The LEFT lateral mid patellofemoral joint space was palpated and marked. The overlying skin was prepped in a sterile fashion. Topical analgesic spray: Ethyl chloride. Joint: LEFT knee Needle: 25ga, 1.5" Completed without difficulty. Meds: depomedorl 40mg , lidocaine 3cc.   Procedure performed: Greater trochanteric corticosteroid injection; palpation guided Consent obtained and verified. Time-out conducted. Noted no overlying erythema, induration, or other signs of local infection. The point of maximal tenderness was palpated over the LEFT GT and and marked. The overlying skin was prepped in a sterile fashion. Topical analgesic spray: Ethyl chloride. Needle: 22GA, 3.5" Completed without difficulty. Meds: depomedrol 40mg , lidocaine 8cc.

## 2018-10-24 DIAGNOSIS — E789 Disorder of lipoprotein metabolism, unspecified: Secondary | ICD-10-CM | POA: Diagnosis not present

## 2018-10-24 DIAGNOSIS — E119 Type 2 diabetes mellitus without complications: Secondary | ICD-10-CM | POA: Diagnosis not present

## 2018-10-24 DIAGNOSIS — I10 Essential (primary) hypertension: Secondary | ICD-10-CM | POA: Diagnosis not present

## 2018-10-27 DIAGNOSIS — I480 Paroxysmal atrial fibrillation: Secondary | ICD-10-CM | POA: Diagnosis not present

## 2018-10-27 DIAGNOSIS — Z7901 Long term (current) use of anticoagulants: Secondary | ICD-10-CM | POA: Diagnosis not present

## 2018-11-03 DIAGNOSIS — Z7901 Long term (current) use of anticoagulants: Secondary | ICD-10-CM | POA: Diagnosis not present

## 2018-11-03 DIAGNOSIS — I480 Paroxysmal atrial fibrillation: Secondary | ICD-10-CM | POA: Diagnosis not present

## 2018-11-03 DIAGNOSIS — I10 Essential (primary) hypertension: Secondary | ICD-10-CM | POA: Diagnosis not present

## 2018-11-03 DIAGNOSIS — E119 Type 2 diabetes mellitus without complications: Secondary | ICD-10-CM | POA: Diagnosis not present

## 2018-11-03 DIAGNOSIS — E789 Disorder of lipoprotein metabolism, unspecified: Secondary | ICD-10-CM | POA: Diagnosis not present

## 2018-11-03 DIAGNOSIS — Z0000 Encounter for general adult medical examination without abnormal findings: Secondary | ICD-10-CM | POA: Diagnosis not present

## 2018-11-12 DIAGNOSIS — Z7901 Long term (current) use of anticoagulants: Secondary | ICD-10-CM | POA: Diagnosis not present

## 2018-11-12 DIAGNOSIS — I480 Paroxysmal atrial fibrillation: Secondary | ICD-10-CM | POA: Diagnosis not present

## 2018-11-19 DIAGNOSIS — Z7901 Long term (current) use of anticoagulants: Secondary | ICD-10-CM | POA: Diagnosis not present

## 2018-12-24 DIAGNOSIS — Z7901 Long term (current) use of anticoagulants: Secondary | ICD-10-CM | POA: Diagnosis not present

## 2018-12-24 DIAGNOSIS — I480 Paroxysmal atrial fibrillation: Secondary | ICD-10-CM | POA: Diagnosis not present

## 2018-12-24 DIAGNOSIS — M1712 Unilateral primary osteoarthritis, left knee: Secondary | ICD-10-CM | POA: Diagnosis not present

## 2019-01-14 DIAGNOSIS — M1712 Unilateral primary osteoarthritis, left knee: Secondary | ICD-10-CM | POA: Diagnosis not present

## 2019-01-14 DIAGNOSIS — I10 Essential (primary) hypertension: Secondary | ICD-10-CM | POA: Diagnosis not present

## 2019-01-14 DIAGNOSIS — I480 Paroxysmal atrial fibrillation: Secondary | ICD-10-CM | POA: Diagnosis not present

## 2019-01-14 DIAGNOSIS — Z7901 Long term (current) use of anticoagulants: Secondary | ICD-10-CM | POA: Diagnosis not present

## 2019-04-29 DIAGNOSIS — E119 Type 2 diabetes mellitus without complications: Secondary | ICD-10-CM | POA: Diagnosis not present

## 2019-04-29 DIAGNOSIS — E789 Disorder of lipoprotein metabolism, unspecified: Secondary | ICD-10-CM | POA: Diagnosis not present

## 2019-05-06 DIAGNOSIS — I10 Essential (primary) hypertension: Secondary | ICD-10-CM | POA: Diagnosis not present

## 2019-05-06 DIAGNOSIS — E119 Type 2 diabetes mellitus without complications: Secondary | ICD-10-CM | POA: Diagnosis not present

## 2019-05-06 DIAGNOSIS — R200 Anesthesia of skin: Secondary | ICD-10-CM | POA: Diagnosis not present

## 2019-05-06 DIAGNOSIS — E789 Disorder of lipoprotein metabolism, unspecified: Secondary | ICD-10-CM | POA: Diagnosis not present

## 2019-05-06 DIAGNOSIS — F419 Anxiety disorder, unspecified: Secondary | ICD-10-CM | POA: Diagnosis not present

## 2019-05-06 DIAGNOSIS — I480 Paroxysmal atrial fibrillation: Secondary | ICD-10-CM | POA: Diagnosis not present

## 2019-05-06 DIAGNOSIS — R413 Other amnesia: Secondary | ICD-10-CM | POA: Diagnosis not present

## 2019-05-07 ENCOUNTER — Encounter: Payer: Self-pay | Admitting: Interventional Cardiology

## 2019-05-07 ENCOUNTER — Telehealth: Payer: Self-pay | Admitting: Interventional Cardiology

## 2019-05-07 NOTE — Telephone Encounter (Signed)
Left message letting pt know that Dr. Smith will not be in the office 9/29 but will be doing virtual/telephone visits instead.  Advised to call back to make arrangements for appt.  

## 2019-05-07 NOTE — Telephone Encounter (Signed)
This encounter was created in error - please disregard.

## 2019-05-07 NOTE — Telephone Encounter (Signed)
Spoke with pt's wife.  Pt still asleep.  Advised Dr. Tamala Julian will not be in the office 9/29 and we needed to switch appt.  She will have pt call when he wakes up.  Wife states that he will likely want to move appt because he does not like virtual.

## 2019-05-12 DIAGNOSIS — R413 Other amnesia: Secondary | ICD-10-CM | POA: Diagnosis not present

## 2019-05-18 ENCOUNTER — Telehealth: Payer: Self-pay

## 2019-05-18 NOTE — Telephone Encounter (Signed)
Pt changed appt to in person with Robbie Lis, PA-C on 9/29

## 2019-05-18 NOTE — Telephone Encounter (Signed)
MEDS REVIEWED   YOUR CARDIOLOGY TEAM HAS ARRANGED FOR AN E-VISIT FOR YOUR APPOINTMENT - PLEASE REVIEW IMPORTANT INFORMATION BELOW SEVERAL DAYS PRIOR TO YOUR APPOINTMENT  Due to the recent COVID-19 pandemic, we are transitioning in-person office visits to tele-medicine visits in an effort to decrease unnecessary exposure to our patients, their families, and staff. These visits are billed to your insurance just like a normal visit is. We also encourage you to sign up for MyChart if you have not already done so. You will need a smartphone if possible. For patients that do not have this, we can still complete the visit using a regular telephone but do prefer a smartphone to enable video when possible. You may have a family member that lives with you that can help. If possible, we also ask that you have a blood pressure cuff and scale at home to measure your blood pressure, heart rate and weight prior to your scheduled appointment. Patients with clinical needs that need an in-person evaluation and testing will still be able to come to the office if absolutely necessary. If you have any questions, feel free to call our office.     YOUR PROVIDER WILL BE USING THE FOLLOWING PLATFORM TO COMPLETE YOUR VISIT: Doximity   . IF USING MYCHART - How to Download the MyChart App to Your SmartPhone   - If Apple, go to App Store and type in MyChart in the search bar and download the app. If Android, ask patient to go to Google Play Store and type in MyChart in the search bar and download the app. The app is free but as with any other app downloads, your phone may require you to verify saved payment information or Apple/Android password.  - You will need to then log into the app with your MyChart username and password, and select [facility] as your healthcare provider to link the account.  - When it is time for your visit, go to the MyChart app, find appointments, and click Begin Video Visit. Be sure to Select Allow for  your device to access the Microphone and Camera for your visit. You will then be connected, and your provider will be with you shortly.  **If you have any issues connecting or need assistance, please contact MyChart service desk [phone] ([phone])**  **If using a computer, in order to ensure the best quality for your visit, you will need to use either of the following Internet Browsers: Google Chrome or Microsoft Edge**  . IF USING DOXIMITY or DOXY.ME - The staff will give you instructions on receiving your link to join the meeting the day of your visit.      2-3 DAYS BEFORE YOUR APPOINTMENT  You will receive a telephone call from one of our HeartCare team members - your caller ID may say "Unknown caller." If this is a video visit, we will walk you through how to get the video launched on your phone. We will remind you check your blood pressure, heart rate and weight prior to your scheduled appointment. If you have an Apple Watch or Kardia, please upload any pertinent ECG strips the day before or morning of your appointment to MyChart. Our staff will also make sure you have reviewed the consent and agree to move forward with your scheduled tele-health visit.     THE DAY OF YOUR APPOINTMENT  Approximately 15 minutes prior to your scheduled appointment, you will receive a telephone call from one of HeartCare team - your caller ID   may say "Unknown caller."  Our staff will confirm medications, vital signs for the day and any symptoms you may be experiencing. Please have this information available prior to the time of visit start. It may also be helpful for you to have a pad of paper and pen handy for any instructions given during your visit. They will also walk you through joining the smartphone meeting if this is a video visit.    CONSENT FOR TELE-HEALTH VISIT - PLEASE REVIEW  I hereby voluntarily request, consent and authorize [facility] and its employed or contracted physicians,  physician assistants, nurse practitioners or other licensed health care professionals (the Practitioner), to provide me with telemedicine health care services (the "Services") as deemed necessary by the treating Practitioner. I acknowledge and consent to receive the Services by the Practitioner via telemedicine. I understand that the telemedicine visit will involve communicating with the Practitioner through live audiovisual communication technology and the disclosure of certain medical information by electronic transmission. I acknowledge that I have been given the opportunity to request an in-person assessment or other available alternative prior to the telemedicine visit and am voluntarily participating in the telemedicine visit.  I understand that I have the right to withhold or withdraw my consent to the use of telemedicine in the course of my care at any time, without affecting my right to future care or treatment, and that the Practitioner or I may terminate the telemedicine visit at any time. I understand that I have the right to inspect all information obtained and/or recorded in the course of the telemedicine visit and may receive copies of available information for a reasonable fee.  I understand that some of the potential risks of receiving the Services via telemedicine include:  . Delay or interruption in medical evaluation due to technological equipment failure or disruption; . Information transmitted may not be sufficient (e.g. poor resolution of images) to allow for appropriate medical decision making by the Practitioner; and/or  . In rare instances, security protocols could fail, causing a breach of personal health information.  Furthermore, I acknowledge that it is my responsibility to provide information about my medical history, conditions and care that is complete and accurate to the best of my ability. I acknowledge that Practitioner's advice, recommendations, and/or decision may be  based on factors not within their control, such as incomplete or inaccurate data provided by me or distortions of diagnostic images or specimens that may result from electronic transmissions. I understand that the practice of medicine is not an exact science and that Practitioner makes no warranties or guarantees regarding treatment outcomes. I acknowledge that I will receive a copy of this consent concurrently upon execution via email to the email address I last provided but may also request a printed copy by calling the office of [facility].    I understand that my insurance will be billed for this visit.   I have read or had this consent read to me. . I understand the contents of this consent, which adequately explains the benefits and risks of the Services being provided via telemedicine.  . I have been provided ample opportunity to ask questions regarding this consent and the Services and have had my questions answered to my satisfaction. . I give my informed consent for the services to be provided through the use of telemedicine in my medical care  By participating in this telemedicine visit I agree to the above.  

## 2019-05-18 NOTE — Progress Notes (Signed)
n

## 2019-05-19 ENCOUNTER — Ambulatory Visit: Payer: Medicare HMO | Admitting: Physician Assistant

## 2019-05-19 ENCOUNTER — Other Ambulatory Visit: Payer: Self-pay

## 2019-05-19 ENCOUNTER — Ambulatory Visit: Payer: Medicare HMO | Admitting: Interventional Cardiology

## 2019-05-19 ENCOUNTER — Encounter: Payer: Self-pay | Admitting: Interventional Cardiology

## 2019-05-19 ENCOUNTER — Telehealth (INDEPENDENT_AMBULATORY_CARE_PROVIDER_SITE_OTHER): Payer: Medicare HMO | Admitting: Interventional Cardiology

## 2019-05-19 VITALS — BP 141/89 | HR 63 | Ht 74.0 in | Wt 240.0 lb

## 2019-05-19 DIAGNOSIS — I5032 Chronic diastolic (congestive) heart failure: Secondary | ICD-10-CM | POA: Diagnosis not present

## 2019-05-19 DIAGNOSIS — Z7901 Long term (current) use of anticoagulants: Secondary | ICD-10-CM

## 2019-05-19 DIAGNOSIS — I482 Chronic atrial fibrillation, unspecified: Secondary | ICD-10-CM

## 2019-05-19 DIAGNOSIS — Z7189 Other specified counseling: Secondary | ICD-10-CM

## 2019-05-19 DIAGNOSIS — R600 Localized edema: Secondary | ICD-10-CM

## 2019-05-19 DIAGNOSIS — I10 Essential (primary) hypertension: Secondary | ICD-10-CM

## 2019-05-19 NOTE — Patient Instructions (Signed)
Medication Instructions:  Your physician recommends that you continue on your current medications as directed. Please refer to the Current Medication list given to you today.  If you need a refill on your cardiac medications before your next appointment, please call your pharmacy.   Lab work: None If you have labs (blood work) drawn today and your tests are completely normal, you will receive your results only by: . MyChart Message (if you have MyChart) OR . A paper copy in the mail If you have any lab test that is abnormal or we need to change your treatment, we will call you to review the results.  Testing/Procedures: None  Follow-Up: At [facility], you and your health needs are our priority.  As part of our continuing mission to provide you with exceptional heart care, we have created designated Provider Care Teams.  These Care Teams include your primary Cardiologist (physician) and Advanced Practice Providers (APPs -  Physician Assistants and Nurse Practitioners) who all work together to provide you with the care you need, when you need it. You will need a follow up appointment in 12 months.  Please call our office 2 months in advance to schedule this appointment.  You may see Henry Smith, MD or one of the following Advanced Practice Providers on your designated Care Team:   Lori Gerhardt, NP Laura Ingold, NP . Jill McDaniel, NP  Any Other Special Instructions Will Be Listed Below (If Applicable).    

## 2019-05-19 NOTE — Progress Notes (Signed)
Virtual Visit via Video Note   This visit type was conducted due to national recommendations for restrictions regarding the COVID-19 Pandemic (e.g. social distancing) in an effort to limit this patient's exposure and mitigate transmission in our community.  Due to his co-morbid illnesses, this patient is at least at moderate risk for complications without adequate follow up.  This format is felt to be most appropriate for this patient at this time.  All issues noted in this document were discussed and addressed.  A limited physical exam was performed with this format.  Please refer to the patient's chart for his consent to telehealth for [facility].   Date:  05/19/2019   ID:  Manuel Turner, DOB Jan 06, 1946, MRN [id number]  Patient Location: Home Provider Location: Home  PCP:  Pearson Grippe, MD  Cardiologist:  No primary care provider on file.  Electrophysiologist:  None   Evaluation Performed:  Follow-Up Visit  Chief Complaint:  AF, CHF  History of Present Illness:    Manuel Turner is a 73 y.o. male with chronic atrial fibrillation, chronic anticoagulation, HFrEF improved, and hypertension.  He feels well.  He has had no bleeding in the urine or stool.  He denies head trauma and episodes of syncope.  There is no orthopnea, chest pain, palpitations, or dyspnea on exertion.  He is maintaining an active lifestyle.  The patient does not have symptoms concerning for COVID-19 infection (fever, chills, cough, or new shortness of breath).    Past Medical History:  Diagnosis Date  . Atrial fibrillation (HCC)   . Diabetes mellitus without complication (HCC)   . Hyperlipemia   . Hypertension    No past surgical history on file.   No outpatient medications have been marked as taking for the 05/19/19 encounter (Appointment) with Lyn Records, MD.   Current Facility-Administered Medications for the 05/19/19 encounter (Appointment) with Lyn Records, MD  Medication  .  methylPREDNISolone acetate (DEPO-MEDROL) injection 40 mg  . methylPREDNISolone acetate (DEPO-MEDROL) injection 40 mg     Allergies:   Patient has no known allergies.   Social History   Tobacco Use  . Smoking status: Former Games developer  . Smokeless tobacco: Never Used  Substance Use Topics  . Alcohol use: No  . Drug use: Not on file     Family Hx: The patient's Family history is unknown by patient.  ROS:   Please see the history of present illness.    States all of his laboratory is done by his physicians at [facility]  States the hemoglobin A1c and potassium levels were in order when evaluated earlier this year. All other systems reviewed and are negative.   Prior CV studies:   The following studies were reviewed today:  No recent cardiovascular imaging data  Labs/Other Tests and Data Reviewed:    EKG:  No ECG reviewed.  Recent Labs: No results found for requested labs within last 8760 hours.   Recent Lipid Panel No results found for: CHOL, TRIG, HDL, CHOLHDL, LDLCALC, LDLDIRECT  Wt Readings from Last 3 Encounters:  03/06/18 241 lb 6.4 oz (109.5 kg)  03/05/17 236 lb 12.8 oz (107.4 kg)  12/07/14 230 lb 12.8 oz (104.7 kg)     Objective:    Vital Signs:  There were no vitals taken for this visit.   VITAL SIGNS:  reviewed GEN:  no acute distress RESPIRATORY:  normal respiratory effort, symmetric expansion CARDIOVASCULAR:  no peripheral edema  ASSESSMENT & PLAN:  1. Chronic heart failure with preserved ejection fraction ([place])   2. Essential hypertension   3. Atrial fibrillation, chronic   4. Chronic anticoagulation   5. Bilateral lower extremity edema   6. Educated About Covid-19 Virus Infection    PLAN:  1. No volume overload based upon the patient's inspection of his legs and breathing. 2. Blood pressure is adequate we controlled. 3. Heart rate is well controlled.  He probably needs to have a digoxin level done at some point.  If this  is not been done by [facility], we will order. 4. No bleeding complications.  Followed by [facility]. 5. Resolved 6. Social distancing, mask wearing, and handwashing is encouraged.  COVID-19 Education: The signs and symptoms of COVID-19 were discussed with the patient and how to seek care for testing (follow up with PCP or arrange E-visit).  The importance of social distancing was discussed today.  Time:   Today, I have spent 15 minutes with the patient with telehealth technology discussing the above problems.     Medication Adjustments/Labs and Tests Ordered: Current medicines are reviewed at length with the patient today.  Concerns regarding medicines are outlined above.   Tests Ordered: No orders of the defined types were placed in this encounter.   Medication Changes: No orders of the defined types were placed in this encounter.   Follow Up:  In Person in 1 year(s)  Signed, Sinclair Grooms, MD  05/19/2019 12:25 PM    [facility]

## 2019-06-03 DIAGNOSIS — F419 Anxiety disorder, unspecified: Secondary | ICD-10-CM | POA: Diagnosis not present

## 2019-06-03 DIAGNOSIS — E119 Type 2 diabetes mellitus without complications: Secondary | ICD-10-CM | POA: Diagnosis not present

## 2019-06-03 DIAGNOSIS — R413 Other amnesia: Secondary | ICD-10-CM | POA: Diagnosis not present

## 2019-06-03 DIAGNOSIS — I480 Paroxysmal atrial fibrillation: Secondary | ICD-10-CM | POA: Diagnosis not present

## 2019-12-08 DIAGNOSIS — Z7984 Long term (current) use of oral hypoglycemic drugs: Secondary | ICD-10-CM | POA: Diagnosis not present

## 2019-12-08 DIAGNOSIS — Z7901 Long term (current) use of anticoagulants: Secondary | ICD-10-CM | POA: Diagnosis not present

## 2019-12-08 DIAGNOSIS — I10 Essential (primary) hypertension: Secondary | ICD-10-CM | POA: Diagnosis not present

## 2019-12-08 DIAGNOSIS — I4891 Unspecified atrial fibrillation: Secondary | ICD-10-CM | POA: Diagnosis not present

## 2019-12-08 DIAGNOSIS — E785 Hyperlipidemia, unspecified: Secondary | ICD-10-CM | POA: Diagnosis not present

## 2019-12-08 DIAGNOSIS — E119 Type 2 diabetes mellitus without complications: Secondary | ICD-10-CM | POA: Diagnosis not present

## 2019-12-08 NOTE — Progress Notes (Signed)
Cardiology Office Note   Date:  12/10/2019   ID:  Manuel Turner, Manuel Turner Feb 06, 1946, MRN [id number]  PCP:  Jarrett Soho, PA-C  Cardiologist:  Dr. Katrinka Blazing, MD   Chief Complaint  Patient presents with  . Follow-up    History of Present Illness: Manuel Turner is a 74 y.o. male who presents for atrial fibrillation and CHF, seen by Dr. Katrinka Blazing  Manuel Turner has a history of chronic atrial fibrillation with chronic anticoagulation and prior HFrEF (last echo from 2011) with improvement and hypertension.  He was most recently seen by Dr. Katrinka Blazing 05/19/2019 and was doing well from a CV standpoint with no anginal, palpitations or chest pain complaints.  He was referred back to our office after being seen by his PCP secondary to stopping all of his medications several months back .   Today, Manuel Turner presents with his wife. He reports a bad PCP experience at his last office and out of frustration he stopped all of his medications including his Coumadin for about one month. He then re-established with a new PCP last week and was referred back to [place] to re-establish cardiac medications. He reports no chest pain, palpitations, SOB, orthopnea, dizziness, or syncope. He restarted his medications last night. His previous dose was 4mg  PO QD for his Coumadin for which he had taken for many years. INR were previously managed by his PCP and this was not discussed at new PCP visit therefore we will schedule him for new patient appointment with our coumadin clinic. We discussed DOAC possibility however he would like to continue with Coumadin. His BP is elevated today however he states that is continues to be frustrated with his prior PCP experience and that is why it is elevated. We discussed bringing him back in about 2 weeks for recheck and to make sure no further adjustments need to be made. Recheck on BP today by myself was 165/95.    Past Medical History:  Diagnosis Date  . Atrial fibrillation (HCC)   .  Diabetes mellitus without complication (HCC)   . Hyperlipemia   . Hypertension     History reviewed. No pertinent surgical history.   Current Outpatient Medications  Medication Sig Dispense Refill  . atorvastatin (LIPITOR) 20 MG tablet Take 20 mg by mouth daily.    . carvedilol (COREG) 6.25 MG tablet Take 6.25 mg by mouth 2 (two) times daily with a meal.    . CINNAMON PO Take 1,000 mg by mouth.    . diazepam (VALIUM) 5 MG tablet Take 2.5-5 mg by mouth daily as needed for anxiety.    . digoxin (LANOXIN) 0.125 MG tablet Take 2 tablets (0.25 mg total) by mouth daily. 180 tablet 3  . ibuprofen (ADVIL,MOTRIN) 600 MG tablet Take 600 mg by mouth every 8 (eight) hours as needed for headache, mild pain or moderate pain.     losartan (COZAAR) 100 MG tablet Take 100 mg by mouth daily.    . meloxicam (MOBIC) 15 MG tablet Take 15 mg by mouth daily.     . metFORMIN (GLUCOPHAGE) 1000 MG tablet Take 1,000 mg by mouth daily with breakfast.     . tiZANidine (ZANAFLEX) 4 MG tablet Take 4 mg by mouth every 8 (eight) hours as needed.     . warfarin (COUMADIN) 4 MG tablet Take 4 mg by mouth daily.      Current Facility-Administered Medications  Medication Dose Route Frequency Provider Last Rate Last Admin  .  methylPREDNISolone acetate (DEPO-MEDROL) injection 40 mg  40 mg Intra-articular Once Hilts, Michael, MD      . methylPREDNISolone acetate (DEPO-MEDROL) injection 40 mg  40 mg Intra-articular Once Hilts, Michael, MD        Allergies:   Patient has no known allergies.    Social History:  The patient  reports that he has quit smoking. He has never used smokeless tobacco. He reports that he does not drink alcohol.   Family History:  The patient's Family history is unknown by patient.    ROS:  Please see the history of present illness.   Otherwise, review of systems are positive for none.   All other systems are reviewed and negative.    PHYSICAL EXAM: VS:  BP (!) 190/102 (BP Location: Right Arm,  Patient Position: Sitting, Cuff Size: Normal)   Pulse 63   Ht 6\' 2"  (1.88 m)   Wt 239 lb 12.8 oz (108.8 kg)   SpO2 97%   BMI 30.79 kg/m  , BMI Body mass index is 30.79 kg/m.   General: Well developed, well nourished, NAD Cardiovascular: Irregularly irregular with S1 S2. No murmurs. Mild 1+ BLE  Neuro: Alert and oriented. No focal deficits. No facial asymmetry. MAE spontaneously. Psych: Responds to questions appropriately with normal affect.     EKG:  EKG is ordered today. The ekg ordered today demonstrates AF with HR 63bpm   Recent Labs: No results found for requested labs within last 8760 hours.    Lipid Panel No results found for: CHOL, TRIG, HDL, CHOLHDL, VLDL, LDLCALC, LDLDIRECT    Wt Readings from Last 3 Encounters:  12/10/19 239 lb 12.8 oz (108.8 kg)  05/19/19 240 lb (108.9 kg)  03/06/18 241 lb 6.4 oz (109.5 kg)     ASSESSMENT AND PLAN:  1.  Chronic heart failure with preserved EF: -Appears euvolemic on exam -Will restart previous medications including carvedilol, losartan -Will need echocardiogram re-check however in the absence of symptoms and patient deferral, will hold off on this for now -Lab work by PCP last week   2.  Hypertension: -Elevated, 165/95>>frustrated and no medications for 1 month -Restart previous regimen and plan for close follow up -Will consider Hyzaar 100/12.5 if no improvement   3.  Chronic atrial fibrillation on anticoagulation: -Heart rate well controlled on EKG today  -On digoxin>>restart -Anticoagulated with Coumadin>>however had not taken this in one month -Wishes to be followed by our Coumadin clinic for now on -Offered DOAC with patient deferral  -Made appointment with our clinic next week with instructions to restart last Coumadin dose at 4mg  PO QD until that appointment for INR check. Discussed with coumadin clinic    Current medicines are reviewed at length with the patient today.  The patient does not have concerns  regarding medicines.  The following changes have been made:  Restart previous medications with close follow up   Labs/ tests ordered today include: None   Orders Placed This Encounter  Procedures  . EKG 12-Lead    Disposition:   FU with myself in 2 weeks  Signed, Kathyrn Drown, NP  12/10/2019 10:33 AM    [address], [place], [place]  [address] Phone: [phone]; Fax: [phone]

## 2019-12-10 ENCOUNTER — Other Ambulatory Visit: Payer: Self-pay

## 2019-12-10 ENCOUNTER — Encounter: Payer: Self-pay | Admitting: Cardiology

## 2019-12-10 ENCOUNTER — Ambulatory Visit: Payer: Medicare HMO | Admitting: Cardiology

## 2019-12-10 VITALS — BP 190/102 | HR 63 | Ht 74.0 in | Wt 239.8 lb

## 2019-12-10 DIAGNOSIS — I5032 Chronic diastolic (congestive) heart failure: Secondary | ICD-10-CM | POA: Diagnosis not present

## 2019-12-10 DIAGNOSIS — Z7901 Long term (current) use of anticoagulants: Secondary | ICD-10-CM

## 2019-12-10 DIAGNOSIS — I10 Essential (primary) hypertension: Secondary | ICD-10-CM | POA: Diagnosis not present

## 2019-12-10 DIAGNOSIS — I482 Chronic atrial fibrillation, unspecified: Secondary | ICD-10-CM | POA: Diagnosis not present

## 2019-12-10 NOTE — Patient Instructions (Signed)
Medication Instructions:   Your physician recommends that you continue on your current medications as directed. Please refer to the Current Medication list given to you today.  *If you need a refill on your cardiac medications before your next appointment, please call your pharmacy*  Lab Work:  None ordered today  Testing/Procedures:  None ordered today  Follow-Up: At [facility], you and your health needs are our priority.  As part of our continuing mission to provide you with exceptional heart care, we have created designated Provider Care Teams.  These Care Teams include your primary Cardiologist (physician) and Advanced Practice Providers (APPs -  Physician Assistants and Nurse Practitioners) who all work together to provide you with the care you need, when you need it.  We recommend signing up for the patient portal called "MyChart".  Sign up information is provided on this After Visit Summary.  MyChart is used to connect with patients for Virtual Visits (Telemedicine).  Patients are able to view lab/test results, encounter notes, upcoming appointments, etc.  Non-urgent messages can be sent to your provider as well.   To learn more about what you can do with MyChart, go to [email or web address].    Your next appointment:    On 12/24/19 at 10:45 with Georgie Chard, NP

## 2019-12-14 NOTE — Progress Notes (Signed)
Cardiology Office Note   Date:  12/24/2019   ID:  Kendarious, Gudino Jan 04, 1946, MRN [id number]  PCP:  Jarrett Soho, PA-C  Cardiologist: Dr. Katrinka Blazing, MD  Chief Complaint  Patient presents with  . Follow-up     History of Present Illness: Manuel Turner is a 74 y.o. male who presents for atrial fibrillation and CHF follow-up, seen for Dr. Katrinka Blazing.  Mr. Bollman has a history of chronic atrial fibrillation with chronic anticoagulation and prior HFrEF (last echo from 2011) with improvement and hypertension.  He was most recently seen by Dr. Katrinka Blazing 05/19/2019 and was doing well from a CV standpoint with no anginal, palpitations or chest pain complaints.  He was referred back to our office after being seen by his PCP secondary to stopping all of his medications several months back .   He was seen by myself 12/10/2019 at which time he reported significant frustration with an experience at his PCPs office at which time he stopped all of his medications including Coumadin therapy for approximately one month. He then re-established with a new PCP and was referred back to re-establish cardiac medications.  Plan was to restart his cardiac medications and resume Coumadin at previous dosing with follow-up in our Coumadin clinic for now on.  BP was elevated therefore plan was to have him return for recheck and possible medication titration.    Today he is here with his daughter and reports that he has been feeling well since we last spoke. His BP remains elevated however he continues to state his BP is much better at home although reports he only takes it about once per week. We discussed adjusting his medications. He is unhappy with this but willing. We also discussed taking his BP on a more regular basis. He denies CP, palpitations, LE edema, orthopnea, PND, dizziness, HAs or syncope.   Past Medical History:  Diagnosis Date  . Atrial fibrillation (HCC)   . Diabetes mellitus without complication  (HCC)   . Hyperlipemia   . Hypertension     History reviewed. No pertinent surgical history.   Current Outpatient Medications  Medication Sig Dispense Refill  . atorvastatin (LIPITOR) 20 MG tablet Take 20 mg by mouth daily.    . carvedilol (COREG) 6.25 MG tablet Take 6.25 mg by mouth 2 (two) times daily with a meal.    . CINNAMON PO Take 1,000 mg by mouth.    . diazepam (VALIUM) 5 MG tablet Take 2.5-5 mg by mouth daily as needed for anxiety.    . digoxin (LANOXIN) 0.125 MG tablet Take 2 tablets (0.25 mg total) by mouth daily. 180 tablet 3  . ibuprofen (ADVIL,MOTRIN) 600 MG tablet Take 600 mg by mouth every 8 (eight) hours as needed for headache, mild pain or moderate pain.     Marland Kitchen losartan (COZAAR) 100 MG tablet Take 100 mg by mouth daily.    . meloxicam (MOBIC) 15 MG tablet Take 15 mg by mouth daily.     . metFORMIN (GLUCOPHAGE) 1000 MG tablet Take 1,000 mg by mouth daily with breakfast.     . tiZANidine (ZANAFLEX) 4 MG tablet Take 4 mg by mouth every 8 (eight) hours as needed.     . warfarin (COUMADIN) 4 MG tablet Take 1 tablet (4 mg total) by mouth daily. 120 tablet 0   Current Facility-Administered Medications  Medication Dose Route Frequency Provider Last Rate Last Admin  . methylPREDNISolone acetate (DEPO-MEDROL) injection 40 mg  40  mg Intra-articular Once Hilts, Michael, MD      . methylPREDNISolone acetate (DEPO-MEDROL) injection 40 mg  40 mg Intra-articular Once Hilts, Michael, MD        Allergies:   Patient has no known allergies.    Social History:  The patient  reports that he has quit smoking. He has never used smokeless tobacco. He reports that he does not drink alcohol.   Family History:  The patient'sFamily history is unknown by patient.    ROS:  Please see the history of present illness.   Otherwise, review of systems are positive for none.  All other systems are reviewed and negative.    PHYSICAL EXAM: VS:  Ht 6\' 2"  (1.88 m)   BMI 30.79 kg/m  , BMI Body mass  index is 30.79 kg/m.    General: Well developed, well nourished, NAD Neck: Negative for carotid bruits. No JVD Lungs:Clear to ausculation bilaterally. No wheezes, rales, or rhonchi. Breathing is unlabored. Cardiovascular: RRR with S1 S2. No murmurs Extremities: No edema. Radial pulses 2+ bilaterally Neuro: Alert and oriented. No focal deficits. No facial asymmetry. MAE spontaneously. Psych: Responds to questions appropriately with normal affect.    EKG:  EKG is not ordered today.  Recent Labs: No results found for requested labs within last 8760 hours.    Lipid Panel No results found for: CHOL, TRIG, HDL, CHOLHDL, VLDL, LDLCALC, LDLDIRECT    Wt Readings from Last 3 Encounters:  12/10/19 239 lb 12.8 oz (108.8 kg)  05/19/19 240 lb (108.9 kg)  03/06/18 241 lb 6.4 oz (109.5 kg)     ASSESSMENT AND PLAN: 1.  Chronic heart failure with preserved EF: -Appears euvolemic on exam -Restarted on carvedilol, losartan at last OV -Will need echocardiogram re-check however in the absence of symptoms and patient deferral, will hold off on this for now -Lab work by PCP last week   2.  Hypertension: -Elevated, 146/100 -Restarted previous regimen  -Reluctant to change medications therefore will increase carvedilol to 12.5 BID -Consider Hyzaar if willing     3.  Chronic atrial fibrillation on anticoagulation: -HR stable today on palpation  -On digoxin>>restarted at last OV  -Anticoagulated with Coumadin>>last INR 1.7  -Follows with our coumadin clinic    Current medicines are reviewed at length with the patient today.  The patient does not have concerns regarding medicines.  The following changes have been made:  Increase carvedilol to 12.5mg  PO BID  Labs/ tests ordered today include: None  No orders of the defined types were placed in this encounter.   Disposition:   FU with Dr. Tamala Julian in 8 months  Signed, Kathyrn Drown, NP  12/24/2019 10:39 AM    [address], [address] [address], [place]  [address] Phone: [phone]; Fax: [phone]

## 2019-12-17 ENCOUNTER — Ambulatory Visit (INDEPENDENT_AMBULATORY_CARE_PROVIDER_SITE_OTHER): Payer: Medicare HMO | Admitting: *Deleted

## 2019-12-17 ENCOUNTER — Other Ambulatory Visit: Payer: Self-pay

## 2019-12-17 DIAGNOSIS — Z5181 Encounter for therapeutic drug level monitoring: Secondary | ICD-10-CM

## 2019-12-17 DIAGNOSIS — I482 Chronic atrial fibrillation, unspecified: Secondary | ICD-10-CM | POA: Diagnosis not present

## 2019-12-17 DIAGNOSIS — Z7901 Long term (current) use of anticoagulants: Secondary | ICD-10-CM

## 2019-12-17 LAB — POCT INR: INR: 1.7 — AB (ref 2.0–3.0)

## 2019-12-17 MED ORDER — WARFARIN SODIUM 4 MG PO TABS: 4.0000 mg | ORAL_TABLET | Freq: Every day | ORAL | 0 refills | Status: DC

## 2019-12-17 NOTE — Patient Instructions (Addendum)
Description   Take 1.5 tablets today and then continue to take 1 tablets daily. Recheck INR in 1 week. Coumadin Clinic [phone]   A full discussion of the nature of anticoagulants has been carried out.  A benefit risk analysis has been presented to the patient, so that they understand the justification for choosing anticoagulation at this time. The need for frequent and regular monitoring, precise dosage adjustment and compliance is stressed.  Side effects of potential bleeding are discussed.  The patient should avoid any OTC items containing aspirin or ibuprofen, and should avoid great swings in general diet.  Avoid alcohol consumption.  Call if any signs of abnormal bleeding.

## 2019-12-21 ENCOUNTER — Telehealth: Payer: Self-pay | Admitting: Cardiology

## 2019-12-21 NOTE — Telephone Encounter (Signed)
New Message    Pts wife is calling and says the pt will need his daughter to assist him to his appt. She says he has memory issues and the last time they came, it was brought up he had memory issues while checking in and the pt got upset Pts wife says he is not very accepting of the issue and it causes a lot of problems for the family She asks that someone be able to accompany him to all of his future appointments and that no one brings up the memory issue to the pt     Please advise

## 2019-12-21 NOTE — Telephone Encounter (Signed)
I called and spoke with patients wife, she is aware that patients daughter can accompany him to his appointment.

## 2019-12-24 ENCOUNTER — Ambulatory Visit: Payer: Medicare HMO | Admitting: Cardiology

## 2019-12-24 ENCOUNTER — Other Ambulatory Visit: Payer: Self-pay

## 2019-12-24 ENCOUNTER — Ambulatory Visit (INDEPENDENT_AMBULATORY_CARE_PROVIDER_SITE_OTHER): Payer: Medicare HMO | Admitting: *Deleted

## 2019-12-24 ENCOUNTER — Encounter: Payer: Self-pay | Admitting: Cardiology

## 2019-12-24 VITALS — BP 146/100 | HR 103 | Ht 74.0 in | Wt 234.0 lb

## 2019-12-24 DIAGNOSIS — I482 Chronic atrial fibrillation, unspecified: Secondary | ICD-10-CM | POA: Diagnosis not present

## 2019-12-24 DIAGNOSIS — Z7901 Long term (current) use of anticoagulants: Secondary | ICD-10-CM | POA: Diagnosis not present

## 2019-12-24 DIAGNOSIS — I5032 Chronic diastolic (congestive) heart failure: Secondary | ICD-10-CM | POA: Diagnosis not present

## 2019-12-24 DIAGNOSIS — Z5181 Encounter for therapeutic drug level monitoring: Secondary | ICD-10-CM

## 2019-12-24 DIAGNOSIS — I10 Essential (primary) hypertension: Secondary | ICD-10-CM

## 2019-12-24 LAB — POCT INR: INR: 2.2 (ref 2.0–3.0)

## 2019-12-24 MED ORDER — CARVEDILOL 12.5 MG PO TABS: 12.5000 mg | ORAL_TABLET | Freq: Two times a day (BID) | ORAL | 3 refills | Status: DC

## 2019-12-24 NOTE — Patient Instructions (Signed)
Description   Continue to take 1 tablets daily. Recheck INR in 1 week. Coumadin Clinic [phone]

## 2019-12-24 NOTE — Patient Instructions (Signed)
Medication Instructions:   Your physician has recommended you make the following change in your medication:   1) Increase Carvedilol to 12.5 mg, 1 tablet by mouth twice a day  *If you need a refill on your cardiac medications before your next appointment, please call your pharmacy*  Lab Work:  None ordered today  Testing/Procedures:  None ordered today  Follow-Up: At [facility], you and your health needs are our priority.  As part of our continuing mission to provide you with exceptional heart care, we have created designated Provider Care Teams.  These Care Teams include your primary Cardiologist (physician) and Advanced Practice Providers (APPs -  Physician Assistants and Nurse Practitioners) who all work together to provide you with the care you need, when you need it.  We recommend signing up for the patient portal called "MyChart".  Sign up information is provided on this After Visit Summary.  MyChart is used to connect with patients for Virtual Visits (Telemedicine).  Patients are able to view lab/test results, encounter notes, upcoming appointments, etc.  Non-urgent messages can be sent to your provider as well.   To learn more about what you can do with MyChart, go to [email or web address].    Your next appointment:   6-9 month(s)  The format for your next appointment:   In Person  Provider:   Verdis Prime, MD

## 2019-12-30 ENCOUNTER — Ambulatory Visit (INDEPENDENT_AMBULATORY_CARE_PROVIDER_SITE_OTHER): Payer: Medicare HMO | Admitting: *Deleted

## 2019-12-30 ENCOUNTER — Other Ambulatory Visit: Payer: Self-pay

## 2019-12-30 DIAGNOSIS — Z5181 Encounter for therapeutic drug level monitoring: Secondary | ICD-10-CM

## 2019-12-30 DIAGNOSIS — I482 Chronic atrial fibrillation, unspecified: Secondary | ICD-10-CM | POA: Diagnosis not present

## 2019-12-30 DIAGNOSIS — Z7901 Long term (current) use of anticoagulants: Secondary | ICD-10-CM | POA: Diagnosis not present

## 2019-12-30 LAB — POCT INR: INR: 2.8 (ref 2.0–3.0)

## 2019-12-30 NOTE — Patient Instructions (Addendum)
Description   Continue to take 1 tablets daily. Recheck INR in 2 weeks. Coumadin Clinic [phone]

## 2020-01-13 ENCOUNTER — Other Ambulatory Visit: Payer: Self-pay

## 2020-01-13 ENCOUNTER — Ambulatory Visit (INDEPENDENT_AMBULATORY_CARE_PROVIDER_SITE_OTHER): Payer: Medicare HMO | Admitting: *Deleted

## 2020-01-13 DIAGNOSIS — Z7901 Long term (current) use of anticoagulants: Secondary | ICD-10-CM

## 2020-01-13 DIAGNOSIS — I482 Chronic atrial fibrillation, unspecified: Secondary | ICD-10-CM | POA: Diagnosis not present

## 2020-01-13 DIAGNOSIS — Z5181 Encounter for therapeutic drug level monitoring: Secondary | ICD-10-CM | POA: Diagnosis not present

## 2020-01-13 LAB — POCT INR: INR: 3.1 — AB (ref 2.0–3.0)

## 2020-01-13 NOTE — Patient Instructions (Signed)
Description   Tomorrow take 1/2 tablet then continue taking 1 tablet daily. Recheck INR in 2 weeks. Coumadin Clinic [phone]

## 2020-01-27 ENCOUNTER — Other Ambulatory Visit: Payer: Self-pay

## 2020-01-27 ENCOUNTER — Ambulatory Visit (INDEPENDENT_AMBULATORY_CARE_PROVIDER_SITE_OTHER): Payer: Medicare HMO | Admitting: *Deleted

## 2020-01-27 DIAGNOSIS — I482 Chronic atrial fibrillation, unspecified: Secondary | ICD-10-CM | POA: Diagnosis not present

## 2020-01-27 DIAGNOSIS — Z5181 Encounter for therapeutic drug level monitoring: Secondary | ICD-10-CM

## 2020-01-27 DIAGNOSIS — Z7901 Long term (current) use of anticoagulants: Secondary | ICD-10-CM

## 2020-01-27 LAB — POCT INR: INR: 1.3 — AB (ref 2.0–3.0)

## 2020-01-27 NOTE — Patient Instructions (Signed)
Description   Take 1.5 tablets today and tomorrow, then continue taking 1 tablet daily. Recheck INR in 1 week. Coumadin Clinic [phone]

## 2020-02-03 ENCOUNTER — Ambulatory Visit (INDEPENDENT_AMBULATORY_CARE_PROVIDER_SITE_OTHER): Payer: Medicare HMO

## 2020-02-03 ENCOUNTER — Other Ambulatory Visit: Payer: Self-pay

## 2020-02-03 DIAGNOSIS — Z7901 Long term (current) use of anticoagulants: Secondary | ICD-10-CM

## 2020-02-03 DIAGNOSIS — Z5181 Encounter for therapeutic drug level monitoring: Secondary | ICD-10-CM | POA: Diagnosis not present

## 2020-02-03 DIAGNOSIS — I482 Chronic atrial fibrillation, unspecified: Secondary | ICD-10-CM

## 2020-02-03 LAB — POCT INR: INR: 2.9 (ref 2.0–3.0)

## 2020-02-03 NOTE — Patient Instructions (Signed)
Description   Continue on same dosage 1 tablet daily. Recheck INR in 2 weeks. Coumadin Clinic [phone]

## 2020-02-17 ENCOUNTER — Ambulatory Visit (INDEPENDENT_AMBULATORY_CARE_PROVIDER_SITE_OTHER): Payer: Medicare HMO | Admitting: *Deleted

## 2020-02-17 ENCOUNTER — Other Ambulatory Visit: Payer: Self-pay

## 2020-02-17 DIAGNOSIS — I482 Chronic atrial fibrillation, unspecified: Secondary | ICD-10-CM

## 2020-02-17 DIAGNOSIS — Z5181 Encounter for therapeutic drug level monitoring: Secondary | ICD-10-CM | POA: Diagnosis not present

## 2020-02-17 DIAGNOSIS — Z7901 Long term (current) use of anticoagulants: Secondary | ICD-10-CM

## 2020-02-17 LAB — POCT INR: INR: 2.6 (ref 2.0–3.0)

## 2020-02-17 NOTE — Patient Instructions (Signed)
Description   Continue on same dosage 1 tablet daily. Recheck INR in 3 weeks. Coumadin Clinic [phone]

## 2020-03-10 ENCOUNTER — Other Ambulatory Visit: Payer: Self-pay

## 2020-03-10 ENCOUNTER — Ambulatory Visit (INDEPENDENT_AMBULATORY_CARE_PROVIDER_SITE_OTHER): Payer: Medicare HMO | Admitting: *Deleted

## 2020-03-10 DIAGNOSIS — Z7901 Long term (current) use of anticoagulants: Secondary | ICD-10-CM

## 2020-03-10 DIAGNOSIS — Z5181 Encounter for therapeutic drug level monitoring: Secondary | ICD-10-CM | POA: Diagnosis not present

## 2020-03-10 DIAGNOSIS — I482 Chronic atrial fibrillation, unspecified: Secondary | ICD-10-CM | POA: Diagnosis not present

## 2020-03-10 LAB — POCT INR: INR: 1.5 — AB (ref 2.0–3.0)

## 2020-03-10 NOTE — Patient Instructions (Signed)
Description   Today and tomorrow take 1.5 tablets then continue on same dosage 1 tablet daily. Recheck INR in 1 week. Coumadin Clinic [phone]

## 2020-03-17 ENCOUNTER — Other Ambulatory Visit: Payer: Self-pay

## 2020-03-17 ENCOUNTER — Ambulatory Visit (INDEPENDENT_AMBULATORY_CARE_PROVIDER_SITE_OTHER): Payer: Medicare HMO | Admitting: Pharmacist

## 2020-03-17 DIAGNOSIS — I482 Chronic atrial fibrillation, unspecified: Secondary | ICD-10-CM | POA: Diagnosis not present

## 2020-03-17 DIAGNOSIS — Z7901 Long term (current) use of anticoagulants: Secondary | ICD-10-CM

## 2020-03-17 DIAGNOSIS — Z5181 Encounter for therapeutic drug level monitoring: Secondary | ICD-10-CM | POA: Diagnosis not present

## 2020-03-17 LAB — POCT INR: INR: 2 (ref 2.0–3.0)

## 2020-03-17 NOTE — Patient Instructions (Signed)
Continue on same dosage 1 tablet daily. Recheck INR in 2 week. Coumadin Clinic [phone]

## 2020-03-31 ENCOUNTER — Other Ambulatory Visit: Payer: Self-pay

## 2020-03-31 ENCOUNTER — Ambulatory Visit (INDEPENDENT_AMBULATORY_CARE_PROVIDER_SITE_OTHER): Payer: Medicare HMO

## 2020-03-31 DIAGNOSIS — I482 Chronic atrial fibrillation, unspecified: Secondary | ICD-10-CM | POA: Diagnosis not present

## 2020-03-31 DIAGNOSIS — Z7901 Long term (current) use of anticoagulants: Secondary | ICD-10-CM | POA: Diagnosis not present

## 2020-03-31 DIAGNOSIS — Z5181 Encounter for therapeutic drug level monitoring: Secondary | ICD-10-CM

## 2020-03-31 LAB — POCT INR: INR: 1.9 — AB (ref 2.0–3.0)

## 2020-03-31 NOTE — Patient Instructions (Signed)
Description   Start taking 1 tablet daily except 1.5 tablets on Thursdays. Recheck INR in 3 weeks. Coumadin Clinic [phone]

## 2020-04-21 ENCOUNTER — Ambulatory Visit (INDEPENDENT_AMBULATORY_CARE_PROVIDER_SITE_OTHER): Payer: Medicare HMO | Admitting: *Deleted

## 2020-04-21 ENCOUNTER — Other Ambulatory Visit: Payer: Self-pay

## 2020-04-21 DIAGNOSIS — Z7901 Long term (current) use of anticoagulants: Secondary | ICD-10-CM

## 2020-04-21 DIAGNOSIS — Z5181 Encounter for therapeutic drug level monitoring: Secondary | ICD-10-CM

## 2020-04-21 DIAGNOSIS — I482 Chronic atrial fibrillation, unspecified: Secondary | ICD-10-CM

## 2020-04-21 LAB — POCT INR: INR: 3 (ref 2.0–3.0)

## 2020-04-21 NOTE — Patient Instructions (Signed)
Description   Continue taking 1 tablet daily except 1.5 tablets on Thursdays. Recheck INR in 4 weeks. Coumadin Clinic [phone]

## 2020-05-10 DIAGNOSIS — Z23 Encounter for immunization: Secondary | ICD-10-CM | POA: Diagnosis not present

## 2020-05-23 ENCOUNTER — Other Ambulatory Visit: Payer: Self-pay

## 2020-05-23 ENCOUNTER — Ambulatory Visit (INDEPENDENT_AMBULATORY_CARE_PROVIDER_SITE_OTHER): Payer: Medicare HMO | Admitting: *Deleted

## 2020-05-23 DIAGNOSIS — Z5181 Encounter for therapeutic drug level monitoring: Secondary | ICD-10-CM | POA: Diagnosis not present

## 2020-05-23 DIAGNOSIS — Z7901 Long term (current) use of anticoagulants: Secondary | ICD-10-CM

## 2020-05-23 DIAGNOSIS — I482 Chronic atrial fibrillation, unspecified: Secondary | ICD-10-CM | POA: Diagnosis not present

## 2020-05-23 LAB — POCT INR: INR: 1.3 — AB (ref 2.0–3.0)

## 2020-05-23 NOTE — Patient Instructions (Signed)
Description    Take 1.5 tablets today and tomorrow, then continue taking 1 tablet daily except 1.5 tablets on Thursdays. Recheck INR in 1 week. Coumadin Clinic [phone]

## 2020-06-01 ENCOUNTER — Other Ambulatory Visit: Payer: Self-pay

## 2020-06-01 ENCOUNTER — Ambulatory Visit (INDEPENDENT_AMBULATORY_CARE_PROVIDER_SITE_OTHER): Payer: Medicare HMO | Admitting: *Deleted

## 2020-06-01 DIAGNOSIS — E785 Hyperlipidemia, unspecified: Secondary | ICD-10-CM | POA: Diagnosis not present

## 2020-06-01 DIAGNOSIS — I482 Chronic atrial fibrillation, unspecified: Secondary | ICD-10-CM | POA: Diagnosis not present

## 2020-06-01 DIAGNOSIS — I10 Essential (primary) hypertension: Secondary | ICD-10-CM | POA: Diagnosis not present

## 2020-06-01 DIAGNOSIS — Z5181 Encounter for therapeutic drug level monitoring: Secondary | ICD-10-CM | POA: Diagnosis not present

## 2020-06-01 DIAGNOSIS — Z7901 Long term (current) use of anticoagulants: Secondary | ICD-10-CM

## 2020-06-01 DIAGNOSIS — I739 Peripheral vascular disease, unspecified: Secondary | ICD-10-CM | POA: Diagnosis not present

## 2020-06-01 DIAGNOSIS — Z125 Encounter for screening for malignant neoplasm of prostate: Secondary | ICD-10-CM | POA: Diagnosis not present

## 2020-06-01 DIAGNOSIS — M25552 Pain in left hip: Secondary | ICD-10-CM | POA: Diagnosis not present

## 2020-06-01 DIAGNOSIS — E119 Type 2 diabetes mellitus without complications: Secondary | ICD-10-CM | POA: Diagnosis not present

## 2020-06-01 DIAGNOSIS — G8929 Other chronic pain: Secondary | ICD-10-CM | POA: Diagnosis not present

## 2020-06-01 DIAGNOSIS — Z0000 Encounter for general adult medical examination without abnormal findings: Secondary | ICD-10-CM | POA: Diagnosis not present

## 2020-06-01 DIAGNOSIS — I4891 Unspecified atrial fibrillation: Secondary | ICD-10-CM | POA: Diagnosis not present

## 2020-06-01 LAB — POCT INR: INR: 2.7 (ref 2.0–3.0)

## 2020-06-01 NOTE — Patient Instructions (Signed)
Description   Continue taking 1 tablet daily except 1.5 tablets on Thursdays. Recheck INR in 2 weeks. Coumadin Clinic [phone]

## 2020-06-13 ENCOUNTER — Ambulatory Visit: Payer: Medicare HMO | Admitting: Podiatry

## 2020-06-17 ENCOUNTER — Other Ambulatory Visit: Payer: Self-pay

## 2020-06-17 ENCOUNTER — Ambulatory Visit (INDEPENDENT_AMBULATORY_CARE_PROVIDER_SITE_OTHER): Payer: Medicare HMO | Admitting: *Deleted

## 2020-06-17 DIAGNOSIS — I482 Chronic atrial fibrillation, unspecified: Secondary | ICD-10-CM

## 2020-06-17 DIAGNOSIS — Z7901 Long term (current) use of anticoagulants: Secondary | ICD-10-CM

## 2020-06-17 DIAGNOSIS — Z5181 Encounter for therapeutic drug level monitoring: Secondary | ICD-10-CM

## 2020-06-17 LAB — POCT INR: INR: 1.3 — AB (ref 2.0–3.0)

## 2020-06-17 NOTE — Patient Instructions (Signed)
Description    Take 1.5 tablets today and tomorrow, then continue taking 1 tablet daily except 1.5 tablets on Thursdays. Recheck INR in 1.5 week. Coumadin Clinic [phone]

## 2020-06-27 ENCOUNTER — Other Ambulatory Visit: Payer: Self-pay

## 2020-06-27 ENCOUNTER — Ambulatory Visit (INDEPENDENT_AMBULATORY_CARE_PROVIDER_SITE_OTHER): Payer: Medicare HMO

## 2020-06-27 DIAGNOSIS — Z7901 Long term (current) use of anticoagulants: Secondary | ICD-10-CM | POA: Diagnosis not present

## 2020-06-27 DIAGNOSIS — Z5181 Encounter for therapeutic drug level monitoring: Secondary | ICD-10-CM

## 2020-06-27 DIAGNOSIS — I482 Chronic atrial fibrillation, unspecified: Secondary | ICD-10-CM | POA: Diagnosis not present

## 2020-06-27 LAB — POCT INR: INR: 1.5 — AB (ref 2.0–3.0)

## 2020-06-27 NOTE — Patient Instructions (Signed)
Description    Take 1.5 tablets today and tomorrow, then start taking 1 tablet daily except 1.5 tablets on Mondays and Thursdays. Recheck INR in 2 weeks. Coumadin Clinic [phone]

## 2020-07-08 ENCOUNTER — Encounter (HOSPITAL_BASED_OUTPATIENT_CLINIC_OR_DEPARTMENT_OTHER): Payer: Self-pay | Admitting: *Deleted

## 2020-07-08 ENCOUNTER — Other Ambulatory Visit: Payer: Self-pay

## 2020-07-08 ENCOUNTER — Emergency Department (HOSPITAL_BASED_OUTPATIENT_CLINIC_OR_DEPARTMENT_OTHER): Payer: Medicare HMO

## 2020-07-08 ENCOUNTER — Emergency Department (HOSPITAL_BASED_OUTPATIENT_CLINIC_OR_DEPARTMENT_OTHER)
Admission: EM | Admit: 2020-07-08 | Discharge: 2020-07-08 | Disposition: A | Discharge status: Home / Self Care | Payer: Medicare HMO | Source: Home / Self Care | Attending: Emergency Medicine | Admitting: Emergency Medicine

## 2020-07-08 DIAGNOSIS — Z01818 Encounter for other preprocedural examination: Secondary | ICD-10-CM | POA: Diagnosis not present

## 2020-07-08 DIAGNOSIS — M868X7 Other osteomyelitis, ankle and foot: Secondary | ICD-10-CM | POA: Diagnosis present

## 2020-07-08 DIAGNOSIS — L97519 Non-pressure chronic ulcer of other part of right foot with unspecified severity: Secondary | ICD-10-CM | POA: Diagnosis present

## 2020-07-08 DIAGNOSIS — R600 Localized edema: Secondary | ICD-10-CM | POA: Diagnosis not present

## 2020-07-08 DIAGNOSIS — E785 Hyperlipidemia, unspecified: Secondary | ICD-10-CM | POA: Insufficient documentation

## 2020-07-08 DIAGNOSIS — E1140 Type 2 diabetes mellitus with diabetic neuropathy, unspecified: Secondary | ICD-10-CM | POA: Diagnosis present

## 2020-07-08 DIAGNOSIS — Z20822 Contact with and (suspected) exposure to covid-19: Secondary | ICD-10-CM | POA: Diagnosis present

## 2020-07-08 DIAGNOSIS — E1169 Type 2 diabetes mellitus with other specified complication: Secondary | ICD-10-CM | POA: Insufficient documentation

## 2020-07-08 DIAGNOSIS — I10 Essential (primary) hypertension: Secondary | ICD-10-CM

## 2020-07-08 DIAGNOSIS — Z7901 Long term (current) use of anticoagulants: Secondary | ICD-10-CM | POA: Diagnosis not present

## 2020-07-08 DIAGNOSIS — E11621 Type 2 diabetes mellitus with foot ulcer: Secondary | ICD-10-CM | POA: Diagnosis present

## 2020-07-08 DIAGNOSIS — M7989 Other specified soft tissue disorders: Secondary | ICD-10-CM | POA: Diagnosis present

## 2020-07-08 DIAGNOSIS — I5030 Unspecified diastolic (congestive) heart failure: Secondary | ICD-10-CM | POA: Insufficient documentation

## 2020-07-08 DIAGNOSIS — Z7984 Long term (current) use of oral hypoglycemic drugs: Secondary | ICD-10-CM | POA: Insufficient documentation

## 2020-07-08 DIAGNOSIS — R233 Spontaneous ecchymoses: Secondary | ICD-10-CM | POA: Diagnosis present

## 2020-07-08 DIAGNOSIS — I110 Hypertensive heart disease with heart failure: Secondary | ICD-10-CM | POA: Insufficient documentation

## 2020-07-08 DIAGNOSIS — M869 Osteomyelitis, unspecified: Secondary | ICD-10-CM | POA: Diagnosis present

## 2020-07-08 DIAGNOSIS — M86171 Other acute osteomyelitis, right ankle and foot: Secondary | ICD-10-CM | POA: Diagnosis not present

## 2020-07-08 DIAGNOSIS — Z79899 Other long term (current) drug therapy: Secondary | ICD-10-CM | POA: Diagnosis not present

## 2020-07-08 DIAGNOSIS — L97509 Non-pressure chronic ulcer of other part of unspecified foot with unspecified severity: Secondary | ICD-10-CM | POA: Diagnosis not present

## 2020-07-08 DIAGNOSIS — L089 Local infection of the skin and subcutaneous tissue, unspecified: Secondary | ICD-10-CM | POA: Diagnosis not present

## 2020-07-08 DIAGNOSIS — M86071 Acute hematogenous osteomyelitis, right ankle and foot: Secondary | ICD-10-CM | POA: Diagnosis not present

## 2020-07-08 DIAGNOSIS — F05 Delirium due to known physiological condition: Secondary | ICD-10-CM | POA: Diagnosis present

## 2020-07-08 DIAGNOSIS — Z87891 Personal history of nicotine dependence: Secondary | ICD-10-CM | POA: Diagnosis not present

## 2020-07-08 DIAGNOSIS — E871 Hypo-osmolality and hyponatremia: Secondary | ICD-10-CM | POA: Diagnosis present

## 2020-07-08 DIAGNOSIS — L0390 Cellulitis, unspecified: Secondary | ICD-10-CM | POA: Diagnosis not present

## 2020-07-08 DIAGNOSIS — E876 Hypokalemia: Secondary | ICD-10-CM | POA: Diagnosis present

## 2020-07-08 DIAGNOSIS — I160 Hypertensive urgency: Secondary | ICD-10-CM | POA: Diagnosis present

## 2020-07-08 DIAGNOSIS — F0390 Unspecified dementia without behavioral disturbance: Secondary | ICD-10-CM | POA: Diagnosis present

## 2020-07-08 DIAGNOSIS — I482 Chronic atrial fibrillation, unspecified: Secondary | ICD-10-CM | POA: Diagnosis present

## 2020-07-08 LAB — BASIC METABOLIC PANEL
Anion gap: 9 (ref 5–15)
BUN: 14 mg/dL (ref 8–23)
CO2: 26 mmol/L (ref 22–32)
Calcium: 9 mg/dL (ref 8.9–10.3)
Chloride: 99 mmol/L (ref 98–111)
Creatinine, Ser: 0.86 mg/dL (ref 0.61–1.24)
GFR, Estimated: >60 mL/min (ref >60)
Glucose, Bld: 136 mg/dL — ABNORMAL HIGH (ref 70–99)
Potassium: 3.2 mmol/L — ABNORMAL LOW (ref 3.5–5.1)
Sodium: 134 mmol/L — ABNORMAL LOW (ref 135–145)

## 2020-07-08 LAB — CBC WITH DIFFERENTIAL/PLATELET
Abs Immature Granulocytes: 0.01 10*3/uL (ref 0.00–0.07)
Basophils Absolute: 0 10*3/uL (ref 0.0–0.1)
Basophils Relative: 0 %
Eosinophils Absolute: 0.1 10*3/uL (ref 0.0–0.5)
Eosinophils Relative: 1 %
HCT: 43 % (ref 39.0–52.0)
Hemoglobin: 14.5 g/dL (ref 13.0–17.0)
Immature Granulocytes: 0 %
Lymphocytes Relative: 22 %
Lymphs Abs: 1.5 10*3/uL (ref 0.7–4.0)
MCH: 29.8 pg (ref 26.0–34.0)
MCHC: 33.7 g/dL (ref 30.0–36.0)
MCV: 88.5 fL (ref 80.0–100.0)
Monocytes Absolute: 0.5 10*3/uL (ref 0.1–1.0)
Monocytes Relative: 7 %
Neutro Abs: 4.5 10*3/uL (ref 1.7–7.7)
Neutrophils Relative %: 70 %
Platelets: 226 10*3/uL (ref 150–400)
RBC: 4.86 MIL/uL (ref 4.22–5.81)
RDW: 12 % (ref 11.5–15.5)
WBC: 6.6 10*3/uL (ref 4.0–10.5)
nRBC: 0 % (ref 0.0–0.2)

## 2020-07-08 LAB — SEDIMENTATION RATE: Sed Rate: 22 mm/hr — ABNORMAL HIGH (ref 0–16)

## 2020-07-08 MED ORDER — AMLODIPINE BESYLATE 5 MG PO TABS
5.0000 mg | ORAL_TABLET | Freq: Once | ORAL | Status: AC
  Administered 2020-07-08: 5 mg via ORAL
  Filled 2020-07-08: qty 1

## 2020-07-08 MED ORDER — AMOXICILLIN-POT CLAVULANATE 875-125 MG PO TABS: 1.0000 | ORAL_TABLET | Freq: Two times a day (BID) | ORAL | 0 refills | Status: DC

## 2020-07-08 MED ORDER — PIPERACILLIN-TAZOBACTAM 3.375 G IVPB 30 MIN
3.3750 g | Freq: Once | INTRAVENOUS | Status: AC
  Administered 2020-07-08: 3.375 g via INTRAVENOUS
  Filled 2020-07-08: qty 50

## 2020-07-08 MED ORDER — VANCOMYCIN HCL 1500 MG/300ML IV SOLN
1500.0000 mg | Freq: Once | INTRAVENOUS | Status: DC
  Filled 2020-07-08: qty 300

## 2020-07-08 NOTE — ED Notes (Signed)
Pt and wife informed of risks of leaving and not being admitted by Dr. Lynelle Doctor. Daughter also involved who states they are going to leave. Daughter informed to contact social work to get involved in case to get further care for pt as he does not want to be admitted.

## 2020-07-08 NOTE — ED Provider Notes (Signed)
[facility] EMERGENCY DEPARTMENT Provider Note   CSN: [id number] Arrival date & time: 07/08/20  1501     History Chief Complaint  Patient presents with  . Wound Check    Manuel Turner is a 74 y.o. male.  HPI   Patient presents to the ED for evaluation of discoloration of his right big toe.  Patient states he noticed it about a week ago.  He has also noticed drainage from a sore on the bottom of his foot.  Patient does have diabetes.  He denies having any fevers or chills.  He denies any vomiting or diarrhea.  He has not noticed the discoloration before.  Patient denies any recent trauma or injuries  Past Medical History:  Diagnosis Date  . Atrial fibrillation (HCC)   . Diabetes mellitus without complication (HCC)   . Hyperlipemia   . Hypertension     Patient Active Problem List   Diagnosis Date Noted  . Encounter for therapeutic drug monitoring 12/17/2019  . (HFpEF) heart failure with preserved ejection fraction (HCC) 12/07/2014  . Atrial fibrillation, chronic (HCC) 03/16/2014  . Essential hypertension 03/16/2014  . Chronic anticoagulation 03/16/2014  . Bilateral lower extremity edema 03/16/2014    History reviewed. No pertinent surgical history.     Family History  Family history unknown: Yes    Social History   Tobacco Use  . Smoking status: Former Games developer  . Smokeless tobacco: Never Used  Substance Use Topics  . Alcohol use: No  . Drug use: Not on file    Home Medications Prior to Admission medications   Medication Sig Start Date End Date Taking? Authorizing Provider  amoxicillin-clavulanate (AUGMENTIN) 875-125 MG tablet Take 1 tablet by mouth 2 (two) times daily. 07/08/20   Linwood Dibbles, MD  atorvastatin (LIPITOR) 20 MG tablet Take 20 mg by mouth daily.    [provider]  carvedilol (COREG) 12.5 MG tablet Take 1 tablet (12.5 mg total) by mouth 2 (two) times daily. 12/24/19 03/23/20  Georgie Chard D, NP  CINNAMON PO Take 1,000 mg by  mouth.    [provider]  diazepam (VALIUM) 5 MG tablet Take 2.5-5 mg by mouth daily as needed for anxiety. 12/28/16   [provider]  digoxin (LANOXIN) 0.125 MG tablet Take 2 tablets (0.25 mg total) by mouth daily. 03/06/18   Lyn Records, MD  ibuprofen (ADVIL,MOTRIN) 600 MG tablet Take 600 mg by mouth every 8 (eight) hours as needed for headache, mild pain or moderate pain.  09/03/18   [provider]  losartan (COZAAR) 100 MG tablet Take 100 mg by mouth daily.    [provider]  meloxicam (MOBIC) 15 MG tablet Take 15 mg by mouth daily.  10/13/18   [provider]  metFORMIN (GLUCOPHAGE) 1000 MG tablet Take 1,000 mg by mouth daily with breakfast.     [provider]  tiZANidine (ZANAFLEX) 4 MG tablet Take 4 mg by mouth every 8 (eight) hours as needed.  10/13/18   [provider]  warfarin (COUMADIN) 4 MG tablet Take 1 tablet (4 mg total) by mouth daily. 12/17/19   Lyn Records, MD    Allergies    Patient has no known allergies.  Review of Systems   Review of Systems  All other systems reviewed and are negative.   Physical Exam Updated Vital Signs BP (!) 194/124 (BP Location: Left Arm)   Pulse 82   Temp 98.3 F (36.8 C) (Oral)   Resp  16   Ht 1.88 m (6\' 2" )   Wt 77.1 kg   SpO2 100%   BMI 21.83 kg/m   Physical Exam Vitals and nursing note reviewed.  Constitutional:      General: He is not in acute distress.    Appearance: He is well-developed.  HENT:     Head: Normocephalic and atraumatic.     Right Ear: External ear normal.     Left Ear: External ear normal.  Eyes:     General: No scleral icterus.       Right eye: No discharge.        Left eye: No discharge.     Conjunctiva/sclera: Conjunctivae normal.  Neck:     Trachea: No tracheal deviation.  Cardiovascular:     Rate and Rhythm: Normal rate and regular rhythm.  Pulmonary:     Effort: Pulmonary effort is normal. No respiratory distress.     Breath  sounds: Normal breath sounds. No stridor. No wheezing or rales.  Abdominal:     General: Bowel sounds are normal. There is no distension.     Palpations: Abdomen is soft.     Tenderness: There is no abdominal tenderness. There is no guarding or rebound.  Musculoskeletal:     Cervical back: Neck supple.     Comments: Black dark tissue with dark callus at the distal aspect of his big toe, no fluctuant or purulent drainage, circular ulcerative wound to bottom of left foot, strong DP pulse  Skin:    General: Skin is warm and dry.     Findings: No rash.  Neurological:     Mental Status: He is alert.     Cranial Nerves: No cranial nerve deficit (no facial droop, extraocular movements intact, no slurred speech).     Sensory: No sensory deficit.     Motor: No abnormal muscle tone or seizure activity.     Coordination: Coordination normal.         ED Results / Procedures / Treatments   Labs (all labs ordered are listed, but only abnormal results are displayed) Labs Reviewed  BASIC METABOLIC PANEL - Abnormal; Notable for the following components:      Result Value   Sodium 134 (*)    Potassium 3.2 (*)    Glucose, Bld 136 (*)    All other components within normal limits  SEDIMENTATION RATE - Abnormal; Notable for the following components:   Sed Rate 22 (*)    All other components within normal limits  CBC WITH DIFFERENTIAL/PLATELET    EKG None  Radiology DG Foot Complete Right  Result Date: 07/08/2020 CLINICAL DATA:  Big toe infection. Patient is diabetic. No known injury. EXAM: RIGHT FOOT COMPLETE - 3+ VIEW COMPARISON:  None. FINDINGS: May be mild skin breakdown in the distal great toe. There is bony erosion of the distal tuft of the distal first phalanx best seen on oblique imaging. There is a focus of air projected over the soft tissues between the proximal first and second phalanges. No other evidence of osteomyelitis. No fractures. IMPRESSION: 1. Probable mild skin breakdown  of the distal soft tissues of the great toe. Mild erosion in the tuft of the distal first phalanx consistent with osteomyelitis. 2. There is a focus of air projected over the soft tissues between the proximal first and second phalanges. Given the findings of osteomyelitis, this gas could be from a gas-forming organism with resulting soft tissue gas. Electronically Signed   By: 07/10/2020  III M.D   On: 07/08/2020 17:57    Procedures Procedures (including critical care time)  Medications Ordered in ED Medications  vancomycin (VANCOREADY) IVPB 1500 mg/300 mL (1,500 mg Intravenous Refused 07/08/20 1840)  amLODipine (NORVASC) tablet 5 mg (5 mg Oral Given 07/08/20 1812)  piperacillin-tazobactam (ZOSYN) IVPB 3.375 g (3.375 g Intravenous New Bag/Given 07/08/20 1840)    ED Course  I have reviewed the triage vital signs and the nursing notes.  Pertinent labs & imaging results that were available during my care of the patient were reviewed by me and considered in my medical decision making (see chart for details).  Clinical Course as of Jul 08 1912  Fri Jul 08, 2020  1825 White blood cell count not elevated. Electrolyte panel normal.   [JK]  1825 Patient remains hypertensive. Plain film x-ray suggest the possibility of osteomyelitis and possible gas tissue forming organism   [JK]    Clinical Course User Index [JK] Linwood Dibbles, MD   MDM Rules/Calculators/A&P                         Patient presented to the ED with complaints of discoloration of his toe.  Patient does have black discoloration suggestive of some necrosis of the tissue.  He does not any significant surrounding erythema but I am concerned about the possibility of deep tissue infection especially considering his diabetes and the ulceration on the bottom of his foot.  Patient is afebrile here.  He does not have a white count.  He does have an elevated sed rate.  Plain film x-ray suggest the possibility of osteomyelitis and possible  gas-forming organism.  I explained these findings to the patient and his wife.  I recommended admission to the hospital for IV antibiotics.  Patient also is hypertensive and he was given a dose of medications but he still remains very hypertensive.  I explained my concerns including that if this gets worse he could develop a bloodstream infection or possible worsening infection that would require amputation.  It is also possible if these infections going treated it can be life-threatening.  Patient understands this.  He does not want to be admitted to the hospital.  He has agreed to at least getting a dose of IV antibiotics while he is here and a prescription for antibiotics.  Patient and family told that if he changes his mind he can return at any time to the ED.  Final Clinical Impression(s) / ED Diagnoses Final diagnoses:  Osteomyelitis of right foot, unspecified type (HCC)  Hypertension, unspecified type    Rx / DC Orders ED Discharge Orders         Ordered    amoxicillin-clavulanate (AUGMENTIN) 875-125 MG tablet  2 times daily        07/08/20 1910           Linwood Dibbles, MD 07/08/20 1913

## 2020-07-08 NOTE — ED Triage Notes (Signed)
C/o right big toe discoloration x 1 week hx DM

## 2020-07-10 ENCOUNTER — Inpatient Hospital Stay (HOSPITAL_COMMUNITY)
Admission: EM | Admit: 2020-07-10 | Discharge: 2020-07-15 | DRG: 617 | Disposition: A | Discharge status: Home Health | Payer: Medicare HMO | Attending: Internal Medicine | Admitting: Internal Medicine

## 2020-07-10 ENCOUNTER — Emergency Department (HOSPITAL_COMMUNITY): Payer: Medicare HMO

## 2020-07-10 ENCOUNTER — Encounter (HOSPITAL_COMMUNITY): Payer: Self-pay | Admitting: Internal Medicine

## 2020-07-10 ENCOUNTER — Other Ambulatory Visit: Payer: Self-pay

## 2020-07-10 DIAGNOSIS — R600 Localized edema: Secondary | ICD-10-CM | POA: Diagnosis not present

## 2020-07-10 DIAGNOSIS — L97509 Non-pressure chronic ulcer of other part of unspecified foot with unspecified severity: Secondary | ICD-10-CM | POA: Diagnosis not present

## 2020-07-10 DIAGNOSIS — I10 Essential (primary) hypertension: Secondary | ICD-10-CM | POA: Diagnosis present

## 2020-07-10 DIAGNOSIS — E1169 Type 2 diabetes mellitus with other specified complication: Principal | ICD-10-CM | POA: Diagnosis present

## 2020-07-10 DIAGNOSIS — E11621 Type 2 diabetes mellitus with foot ulcer: Secondary | ICD-10-CM | POA: Diagnosis present

## 2020-07-10 DIAGNOSIS — M869 Osteomyelitis, unspecified: Secondary | ICD-10-CM | POA: Diagnosis present

## 2020-07-10 DIAGNOSIS — F0390 Unspecified dementia without behavioral disturbance: Secondary | ICD-10-CM | POA: Diagnosis present

## 2020-07-10 DIAGNOSIS — E785 Hyperlipidemia, unspecified: Secondary | ICD-10-CM | POA: Diagnosis present

## 2020-07-10 DIAGNOSIS — E871 Hypo-osmolality and hyponatremia: Secondary | ICD-10-CM | POA: Diagnosis present

## 2020-07-10 DIAGNOSIS — M86071 Acute hematogenous osteomyelitis, right ankle and foot: Secondary | ICD-10-CM | POA: Diagnosis not present

## 2020-07-10 DIAGNOSIS — L97519 Non-pressure chronic ulcer of other part of right foot with unspecified severity: Secondary | ICD-10-CM | POA: Diagnosis present

## 2020-07-10 DIAGNOSIS — I160 Hypertensive urgency: Secondary | ICD-10-CM | POA: Diagnosis present

## 2020-07-10 DIAGNOSIS — L0390 Cellulitis, unspecified: Secondary | ICD-10-CM | POA: Diagnosis not present

## 2020-07-10 DIAGNOSIS — I482 Chronic atrial fibrillation, unspecified: Secondary | ICD-10-CM | POA: Diagnosis present

## 2020-07-10 DIAGNOSIS — E1140 Type 2 diabetes mellitus with diabetic neuropathy, unspecified: Secondary | ICD-10-CM | POA: Diagnosis present

## 2020-07-10 DIAGNOSIS — M7989 Other specified soft tissue disorders: Secondary | ICD-10-CM | POA: Diagnosis present

## 2020-07-10 DIAGNOSIS — Z7901 Long term (current) use of anticoagulants: Secondary | ICD-10-CM | POA: Diagnosis not present

## 2020-07-10 DIAGNOSIS — Z79899 Other long term (current) drug therapy: Secondary | ICD-10-CM | POA: Diagnosis not present

## 2020-07-10 DIAGNOSIS — Z87891 Personal history of nicotine dependence: Secondary | ICD-10-CM | POA: Diagnosis not present

## 2020-07-10 DIAGNOSIS — F05 Delirium due to known physiological condition: Secondary | ICD-10-CM | POA: Diagnosis present

## 2020-07-10 DIAGNOSIS — Z20822 Contact with and (suspected) exposure to covid-19: Secondary | ICD-10-CM | POA: Diagnosis present

## 2020-07-10 DIAGNOSIS — R233 Spontaneous ecchymoses: Secondary | ICD-10-CM | POA: Diagnosis present

## 2020-07-10 DIAGNOSIS — E876 Hypokalemia: Secondary | ICD-10-CM | POA: Diagnosis present

## 2020-07-10 DIAGNOSIS — Z7984 Long term (current) use of oral hypoglycemic drugs: Secondary | ICD-10-CM

## 2020-07-10 DIAGNOSIS — M868X7 Other osteomyelitis, ankle and foot: Secondary | ICD-10-CM | POA: Diagnosis present

## 2020-07-10 DIAGNOSIS — M86271 Subacute osteomyelitis, right ankle and foot: Secondary | ICD-10-CM

## 2020-07-10 LAB — COMPREHENSIVE METABOLIC PANEL
ALT: 22 U/L (ref 0–44)
AST: 22 U/L (ref 15–41)
Albumin: 3.8 g/dL (ref 3.5–5.0)
Alkaline Phosphatase: 81 U/L (ref 38–126)
Anion gap: 10 (ref 5–15)
BUN: 18 mg/dL (ref 8–23)
CO2: 25 mmol/L (ref 22–32)
Calcium: 9.2 mg/dL (ref 8.9–10.3)
Chloride: 99 mmol/L (ref 98–111)
Creatinine, Ser: 0.95 mg/dL (ref 0.61–1.24)
GFR, Estimated: >60 mL/min (ref >60)
Glucose, Bld: 154 mg/dL — ABNORMAL HIGH (ref 70–99)
Potassium: 3.4 mmol/L — ABNORMAL LOW (ref 3.5–5.1)
Sodium: 134 mmol/L — ABNORMAL LOW (ref 135–145)
Total Bilirubin: 0.7 mg/dL (ref 0.3–1.2)
Total Protein: 7.1 g/dL (ref 6.5–8.1)

## 2020-07-10 LAB — CBC WITH DIFFERENTIAL/PLATELET
Abs Immature Granulocytes: 0.03 10*3/uL (ref 0.00–0.07)
Basophils Absolute: 0 10*3/uL (ref 0.0–0.1)
Basophils Relative: 0 %
Eosinophils Absolute: 0 10*3/uL (ref 0.0–0.5)
Eosinophils Relative: 1 %
HCT: 42.5 % (ref 39.0–52.0)
Hemoglobin: 14.3 g/dL (ref 13.0–17.0)
Immature Granulocytes: 1 %
Lymphocytes Relative: 16 %
Lymphs Abs: 1.1 10*3/uL (ref 0.7–4.0)
MCH: 30 pg (ref 26.0–34.0)
MCHC: 33.6 g/dL (ref 30.0–36.0)
MCV: 89.3 fL (ref 80.0–100.0)
Monocytes Absolute: 0.5 10*3/uL (ref 0.1–1.0)
Monocytes Relative: 7 %
Neutro Abs: 5 10*3/uL (ref 1.7–7.7)
Neutrophils Relative %: 75 %
Platelets: 237 10*3/uL (ref 150–400)
RBC: 4.76 MIL/uL (ref 4.22–5.81)
RDW: 12.1 % (ref 11.5–15.5)
WBC: 6.6 10*3/uL (ref 4.0–10.5)
nRBC: 0 % (ref 0.0–0.2)

## 2020-07-10 LAB — HEMOGLOBIN A1C
Hgb A1c MFr Bld: 7 % — ABNORMAL HIGH (ref 4.8–5.6)
Mean Plasma Glucose: 154.2 mg/dL

## 2020-07-10 LAB — GLUCOSE, CAPILLARY: Glucose-Capillary: 178 mg/dL — ABNORMAL HIGH (ref 70–99)

## 2020-07-10 LAB — RESPIRATORY PANEL BY RT PCR (FLU A&B, COVID)
Influenza A by PCR: NEGATIVE
Influenza B by PCR: NEGATIVE
SARS Coronavirus 2 by RT PCR: NEGATIVE

## 2020-07-10 LAB — HIV ANTIBODY (ROUTINE TESTING W REFLEX): HIV Screen 4th Generation wRfx: NONREACTIVE

## 2020-07-10 LAB — CBG MONITORING, ED
Glucose-Capillary: 148 mg/dL — ABNORMAL HIGH (ref 70–99)
Glucose-Capillary: 178 mg/dL — ABNORMAL HIGH (ref 70–99)

## 2020-07-10 LAB — LACTIC ACID, PLASMA: Lactic Acid, Venous: 0.9 mmol/L (ref 0.5–1.9)

## 2020-07-10 LAB — C-REACTIVE PROTEIN: CRP: 0.5 mg/dL (ref <1.0)

## 2020-07-10 LAB — PREALBUMIN: Prealbumin: 15.6 mg/dL — ABNORMAL LOW (ref 18–38)

## 2020-07-10 LAB — PROTIME-INR
INR: 1.1 (ref 0.8–1.2)
Prothrombin Time: 14.1 seconds (ref 11.4–15.2)

## 2020-07-10 LAB — SEDIMENTATION RATE: Sed Rate: 12 mm/hr (ref 0–16)

## 2020-07-10 MED ORDER — DIGOXIN 125 MCG PO TABS
0.2500 mg | ORAL_TABLET | Freq: Every day | ORAL | Status: DC
  Administered 2020-07-10 – 2020-07-11 (×2): 0.25 mg via ORAL
  Filled 2020-07-10 (×2): qty 2

## 2020-07-10 MED ORDER — PIPERACILLIN-TAZOBACTAM 3.375 G IVPB
3.3750 g | Freq: Three times a day (TID) | INTRAVENOUS | Status: DC
  Administered 2020-07-10 – 2020-07-14 (×11): 3.375 g via INTRAVENOUS
  Filled 2020-07-10 (×11): qty 50

## 2020-07-10 MED ORDER — HEPARIN BOLUS VIA INFUSION
5000.0000 [IU] | Freq: Once | INTRAVENOUS | Status: AC
  Administered 2020-07-10: 5000 [IU] via INTRAVENOUS
  Filled 2020-07-10: qty 5000

## 2020-07-10 MED ORDER — PIPERACILLIN-TAZOBACTAM 3.375 G IVPB 30 MIN
3.3750 g | Freq: Once | INTRAVENOUS | Status: AC
  Administered 2020-07-10: 3.375 g via INTRAVENOUS
  Filled 2020-07-10: qty 50

## 2020-07-10 MED ORDER — VANCOMYCIN HCL 1500 MG/300ML IV SOLN
1500.0000 mg | Freq: Once | INTRAVENOUS | Status: AC
  Administered 2020-07-10: 1500 mg via INTRAVENOUS
  Filled 2020-07-10: qty 300

## 2020-07-10 MED ORDER — CARVEDILOL 12.5 MG PO TABS
12.5000 mg | ORAL_TABLET | Freq: Two times a day (BID) | ORAL | Status: DC
  Administered 2020-07-10 – 2020-07-15 (×11): 12.5 mg via ORAL
  Filled 2020-07-10 (×11): qty 1

## 2020-07-10 MED ORDER — ONDANSETRON HCL 4 MG/2ML IJ SOLN: 4.0000 mg | Freq: Four times a day (QID) | INTRAMUSCULAR | Status: DC | PRN

## 2020-07-10 MED ORDER — ACETAMINOPHEN 650 MG RE SUPP: 650.0000 mg | Freq: Four times a day (QID) | RECTAL | Status: DC | PRN

## 2020-07-10 MED ORDER — METOPROLOL TARTRATE 5 MG/5ML IV SOLN
5.0000 mg | Freq: Four times a day (QID) | INTRAVENOUS | Status: DC | PRN
  Administered 2020-07-10: 5 mg via INTRAVENOUS
  Filled 2020-07-10: qty 5

## 2020-07-10 MED ORDER — ATORVASTATIN CALCIUM 10 MG PO TABS
20.0000 mg | ORAL_TABLET | Freq: Every day | ORAL | Status: DC
  Administered 2020-07-10 – 2020-07-15 (×5): 20 mg via ORAL
  Filled 2020-07-10 (×5): qty 2

## 2020-07-10 MED ORDER — ALBUTEROL SULFATE (2.5 MG/3ML) 0.083% IN NEBU: 2.5000 mg | INHALATION_SOLUTION | Freq: Four times a day (QID) | RESPIRATORY_TRACT | Status: DC | PRN

## 2020-07-10 MED ORDER — DIAZEPAM 5 MG PO TABS
2.5000 mg | ORAL_TABLET | Freq: Two times a day (BID) | ORAL | Status: DC | PRN
  Administered 2020-07-11 – 2020-07-14 (×2): 2.5 mg via ORAL
  Filled 2020-07-10 (×2): qty 1

## 2020-07-10 MED ORDER — VANCOMYCIN HCL 1250 MG/250ML IV SOLN
1250.0000 mg | Freq: Two times a day (BID) | INTRAVENOUS | Status: DC
  Administered 2020-07-11 – 2020-07-13 (×5): 1250 mg via INTRAVENOUS
  Filled 2020-07-10 (×6): qty 250

## 2020-07-10 MED ORDER — SODIUM CHLORIDE 0.9% FLUSH
3.0000 mL | Freq: Two times a day (BID) | INTRAVENOUS | Status: DC
  Administered 2020-07-10 – 2020-07-14 (×7): 3 mL via INTRAVENOUS

## 2020-07-10 MED ORDER — POTASSIUM CHLORIDE CRYS ER 20 MEQ PO TBCR
40.0000 meq | EXTENDED_RELEASE_TABLET | ORAL | Status: AC
  Administered 2020-07-10: 40 meq via ORAL
  Filled 2020-07-10: qty 2

## 2020-07-10 MED ORDER — ONDANSETRON HCL 4 MG PO TABS: 4.0000 mg | ORAL_TABLET | Freq: Four times a day (QID) | ORAL | Status: DC | PRN

## 2020-07-10 MED ORDER — ACETAMINOPHEN 325 MG PO TABS: 650.0000 mg | ORAL_TABLET | Freq: Four times a day (QID) | ORAL | Status: DC | PRN

## 2020-07-10 MED ORDER — INSULIN ASPART 100 UNIT/ML ~~LOC~~ SOLN
0.0000 [IU] | Freq: Three times a day (TID) | SUBCUTANEOUS | Status: DC
  Administered 2020-07-10 – 2020-07-12 (×2): 1 [IU] via SUBCUTANEOUS
  Administered 2020-07-12: 2 [IU] via SUBCUTANEOUS
  Administered 2020-07-14 – 2020-07-15 (×4): 1 [IU] via SUBCUTANEOUS

## 2020-07-10 MED ORDER — HEPARIN (PORCINE) 25000 UT/250ML-% IV SOLN
1800.0000 [IU]/h | INTRAVENOUS | Status: DC
  Administered 2020-07-10: 1100 [IU]/h via INTRAVENOUS
  Administered 2020-07-11: 1300 [IU]/h via INTRAVENOUS
  Administered 2020-07-13: 1600 [IU]/h via INTRAVENOUS
  Filled 2020-07-10 (×5): qty 250

## 2020-07-10 MED ORDER — ENOXAPARIN SODIUM 40 MG/0.4ML ~~LOC~~ SOLN
40.0000 mg | SUBCUTANEOUS | Status: DC
  Filled 2020-07-10: qty 0.4

## 2020-07-10 MED ORDER — FUROSEMIDE 10 MG/ML IJ SOLN
20.0000 mg | Freq: Once | INTRAMUSCULAR | Status: AC
  Administered 2020-07-10: 20 mg via INTRAVENOUS
  Filled 2020-07-10: qty 2

## 2020-07-10 MED ORDER — LOSARTAN POTASSIUM 50 MG PO TABS
100.0000 mg | ORAL_TABLET | Freq: Every day | ORAL | Status: DC
  Administered 2020-07-10 – 2020-07-12 (×3): 100 mg via ORAL
  Filled 2020-07-10 (×3): qty 2

## 2020-07-10 NOTE — ED Notes (Signed)
Dinner Tray Ordered 1650 -ms 

## 2020-07-10 NOTE — Progress Notes (Signed)
Pharmacy Antibiotic Note  Manuel Turner is a 74 y.o. male admitted on 07/10/2020 with Osteomyelitis.  Pharmacy has been consulted for Zosyn and Vancomycin dosing.   Height: 6\' 2"  (188 cm) Weight: 74.8 kg (165 lb) IBW/kg (Calculated) : 82.2  Temp (24hrs), Avg:98.4 F (36.9 C), Min:98.2 F (36.8 C), Max:98.6 F (37 C)  Recent Labs  Lab 07/08/20 1737  WBC 6.6  CREATININE 0.86    Estimated Creatinine Clearance: 82.1 mL/min (by C-G formula based on SCr of 0.86 mg/dL).    No Known Allergies  Antimicrobials this admission: 11/21 Zosyn >>  11/21 Vancomycin >>   Dose adjustments this admission: N/a  Microbiology results: Pending   Plan:  - Zosyn 3.375g IV x 1 dose over 30 min followed by Zosyn 3.375g IV every 8 hours infused over 4 hours  - Vancomycin 1500mg  IV x 1 dose  - Followed by Vancomycin 1250mg  IV q12h - Goal trough 15-20  - Monitor patients renal function and urine output  - De-escalate ABX when appropriate   Thank you for allowing pharmacy to be a part of this patient's care.  12/21 PharmD. BCPS 07/10/2020 11:45 AM

## 2020-07-10 NOTE — Consult Note (Signed)
WOC Nurse Consult Note: Reason for Consult: Full thickness wounds on right great toe tip and on first metatarsal head Wound type: perfusion and neuropathic Pressure Injury POA: N/A Measurement: To be obtained by Bedside RN and documented on Nursing Flow Sheet prior to first topical care provision Wound bed: Drainage (amount, consistency, odor)  Periwound: Dressing procedure/placement/frequency: I have provided topical care guidance for Nursing and bilateral Pressure redistribution heel boots. I discussed the patient and his POC with Dr. Katrinka Blazing via Secure Chat and recommended consultation with orthopedics or Podiatry. He has consulted Orthopedics and Dr Lajoyce Corners will see tomorrow.  WOC nursing team will not follow, but will remain available to this patient, the nursing and medical teams.  Please re-consult if needed. Thanks, Ladona Mow, MSN, RN, GNP, [place]  Pager# [phone]

## 2020-07-10 NOTE — Progress Notes (Addendum)
0141 Received pt from ED, alert and oriented, anxious. Tip of Right great toe black, non-pitting edema noted to bil lower legs. Pt is impulsive, unsteady gait.  1930 Pt's daughter at the bedside, pt is calmer.

## 2020-07-10 NOTE — ED Notes (Signed)
Attempted report 

## 2020-07-10 NOTE — H&P (Addendum)
History and Physical    Manuel Turner LXB:[id number] DOB: 26-Feb-1946 DOA: 07/10/2020  Referring MD/NP/PA: Margarita Mail, PA-C PCP: Marda Stalker, PA-C  Patient coming from: Home  Chief Complaint: Wound of right toe  I have personally briefly reviewed patient's old medical records in [place]   HPI: Manuel Turner is a 74 y.o. male with medical history significant of chronic atrial fibrillation on Coumadin, hypertension, hyperlipidemia, and diabetes mellitus type 2 presents for a nonhealing wound of his right great toe.  History is obtained with the assistance of his wife is present at bedside he initially noticed the wound approximately 2 weeks ago, but does not recall sustaining any trauma or injury to the foot.  Patient had associated symptoms of some drainage and leg swelling.  He has no feeling in his feet and denies having any pain.  Denies having any significant fever, chills, chest pain, shortness of breath, nausea, vomiting, or diarrhea.  His family noticed the wound 3 days ago and after notifying his primary care provider took him to be evaluated at [facility] 11/19.  He was evaluated with x-rays of the right foot significant for mild erosion in the tip of the distal right right phalanx consistent with osteomyelitis with the possibility callus formation.  Labs revealed mild elevation in ESR of 22.  He had been given 1 dose of Zosyn IV while in the emergency department.  The ED provider had recommended admission, but the patient declined and he was sent home with prescription for Augmentin.  The patient never picked up the Augmentin prescription.  His wife convinced him to come back to hospital as his foot did not seem to be getting better.  Patient wife also notes that he has not been taking any of his other medications besides his Coumadin.  He is followed in outpatient setting by Dr. Daneen Schick for history of atrial fibrillation.   ED Course: On admission to the  emergency department patient was seen to be afebrile with blood pressures elevated up to 196/126, and all other vital signs maintained.  Labs significant for sodium 134, potassium 3.4, glucose 154, lactic acid 0.9, sedimentation rate 12, and CRP 0.5.  Chest x-ray was otherwise noted to be clear.  Patient was started on empiric antibiotics of vancomycin and Zosyn.  Orthopedics Dr. Alvan Dame was consulted and Dr.Duda to see in a.m.  Review of Systems  Constitutional: Negative for fever and malaise/fatigue.  HENT: Negative for congestion and nosebleeds.   Eyes: Negative for double vision and photophobia.  Respiratory: Negative for cough and shortness of breath.   Cardiovascular: Positive for leg swelling. Negative for chest pain.  Gastrointestinal: Negative for abdominal pain, nausea and vomiting.  Genitourinary: Negative for dysuria and hematuria.  Musculoskeletal: Negative for back pain and neck pain.  Skin:       Positive for lower extremity wound  Neurological: Positive for sensory change. Negative for loss of consciousness.  Psychiatric/Behavioral: Negative for hallucinations and substance abuse.    Past Medical History:  Diagnosis Date   Atrial fibrillation ([place])    Diabetes mellitus without complication ([place])    Hyperlipemia    Hypertension     No past surgical history on file.   reports that he has quit smoking. He has never used smokeless tobacco. He reports that he does not drink alcohol. No history on file for drug use.  No Known Allergies  Family History  Family history unknown: Yes    Prior to Admission  medications   Medication Sig Start Date End Date Taking? Authorizing Provider  amoxicillin-clavulanate (AUGMENTIN) 875-125 MG tablet Take 1 tablet by mouth 2 (two) times daily. 07/08/20   Dorie Rank, MD  atorvastatin (LIPITOR) 20 MG tablet Take 20 mg by mouth daily.    [provider]  carvedilol (COREG) 12.5 MG tablet Take 1 tablet (12.5 mg total) by mouth 2  (two) times daily. 12/24/19 03/23/20  Kathyrn Drown D, NP  CINNAMON PO Take 1,000 mg by mouth.    [provider]  diazepam (VALIUM) 5 MG tablet Take 2.5-5 mg by mouth daily as needed for anxiety. 12/28/16   [provider]  digoxin (LANOXIN) 0.125 MG tablet Take 2 tablets (0.25 mg total) by mouth daily. 03/06/18   Belva Crome, MD  ibuprofen (ADVIL,MOTRIN) 600 MG tablet Take 600 mg by mouth every 8 (eight) hours as needed for headache, mild pain or moderate pain.  09/03/18   [provider]  losartan (COZAAR) 100 MG tablet Take 100 mg by mouth daily.    [provider]  meloxicam (MOBIC) 15 MG tablet Take 15 mg by mouth daily.  10/13/18   [provider]  metFORMIN (GLUCOPHAGE) 1000 MG tablet Take 1,000 mg by mouth daily with breakfast.     [provider]  tiZANidine (ZANAFLEX) 4 MG tablet Take 4 mg by mouth every 8 (eight) hours as needed.  10/13/18   [provider]  warfarin (COUMADIN) 4 MG tablet Take 1 tablet (4 mg total) by mouth daily. 12/17/19   Belva Crome, MD    Physical Exam:  Constitutional: Elderly male currently in no acute distress Vitals:   07/10/20 1106 07/10/20 1108 07/10/20 1215 07/10/20 1330  BP: (!) 170/103  (!) 175/94 (!) 196/126  Pulse: 96  62 97  Resp: _0 Temp: 98.2 F (36.8 C)     TempSrc: Oral     SpO2: 100%  100% 100%  Weight:  74.8 kg    Height:  _1  (1.88 m)     Eyes: PERRL, lids and conjunctivae normal ENMT: Mucous membranes are moist. Posterior pharynx clear of any exudate or lesions.  Neck: normal, supple, no masses, no thyromegaly Respiratory: clear to auscultation bilaterally, no wheezing, no crackles. Normal respiratory effort. No accessory muscle use.  Cardiovascular: Irregular irregular, no murmurs / rubs / gallops.  2+ lower extremity extremity edema. 2+ pedal pulses. No carotid bruits.  Abdomen: no tenderness, no masses palpated. No hepatosplenomegaly. Bowel sounds positive.    Musculoskeletal: Swelling noted of the right great toe. Skin: Ulceration and discoloration of the right great toe as seen below Neurologic: CN 2-12 grossly intact.  Lack of sensation bilateral feet DTR normal. Strength 5/5 in all 4.  Psychiatric: Normal judgment and insight. Alert and oriented x 3. Normal mood.     Labs on Admission: I have personally reviewed following labs and imaging studies  CBC: Recent Labs  Lab 07/08/20 1737 07/10/20 1139  WBC 6.6 6.6  NEUTROABS 4.5 5.0  HGB 14.5 14.3  HCT 43.0 42.5  MCV 88.5 89.3  PLT 226 235   Basic Metabolic Panel: Recent Labs  Lab 07/08/20 1737 07/10/20 1139  NA 134* 134*  K 3.2* 3.4*  CL 99 99  CO2 26 25  GLUCOSE 136* 154*  BUN 14 18  CREATININE 0.86 0.95  CALCIUM 9.0 9.2   GFR: Estimated Creatinine Clearance: 74.4 mL/min (by C-G formula based on SCr of 0.95 mg/dL). Liver Function  Tests: Recent Labs  Lab 07/10/20 1139  AST 22  ALT 22  ALKPHOS 81  BILITOT 0.7  PROT 7.1  ALBUMIN 3.8   No results for input(s): LIPASE, AMYLASE in the last 168 hours. No results for input(s): AMMONIA in the last 168 hours. Coagulation Profile: No results for input(s): INR, PROTIME in the last 168 hours. Cardiac Enzymes: No results for input(s): CKTOTAL, CKMB, CKMBINDEX, TROPONINI in the last 168 hours. BNP (last 3 results) No results for input(s): PROBNP in the last 8760 hours. HbA1C: No results for input(s): HGBA1C in the last 72 hours. CBG: Recent Labs  Lab 07/10/20 1058  GLUCAP 148*   Lipid Profile: No results for input(s): CHOL, HDL, LDLCALC, TRIG, CHOLHDL, LDLDIRECT in the last 72 hours. Thyroid Function Tests: No results for input(s): TSH, T4TOTAL, FREET4, T3FREE, THYROIDAB in the last 72 hours. Anemia Panel: No results for input(s): VITAMINB12, FOLATE, FERRITIN, TIBC, IRON, RETICCTPCT in the last 72 hours. Urine analysis: No results found for: COLORURINE, APPEARANCEUR, LABSPEC, PHURINE, GLUCOSEU, HGBUR, BILIRUBINUR,  KETONESUR, PROTEINUR, UROBILINOGEN, NITRITE, LEUKOCYTESUR Sepsis Labs: Recent Results (from the past 240 hour(s))  Respiratory Panel by RT PCR (Flu A&B, Covid) - Nasopharyngeal Swab     Status: None   Collection Time: 07/10/20 12:08 PM   Specimen: Nasopharyngeal Swab; Nasopharyngeal(NP) swabs in vial transport medium  Result Value Ref Range Status   SARS Coronavirus 2 by RT PCR NEGATIVE NEGATIVE Final    Comment: (NOTE) SARS-CoV-2 target nucleic acids are NOT DETECTED.  The SARS-CoV-2 RNA is generally detectable in upper respiratoy specimens during the acute phase of infection. The lowest concentration of SARS-CoV-2 viral copies this assay can detect is 131 copies/mL. A negative result does not preclude SARS-Cov-2 infection and should not be used as the sole basis for treatment or other patient management decisions. A negative result may occur with  improper specimen collection/handling, submission of specimen other than nasopharyngeal swab, presence of viral mutation(s) within the areas targeted by this assay, and inadequate number of viral copies (<131 copies/mL). A negative result must be combined with clinical observations, patient history, and epidemiological information. The expected result is Negative.  Fact Sheet for Patients:  PinkCheek.be  Fact Sheet for Healthcare Providers:  GravelBags.it  This test is no t yet approved or cleared by the [place] FDA and  has been authorized for detection and/or diagnosis of SARS-CoV-2 by FDA under an Emergency Use Authorization (EUA). This EUA will remain  in effect (meaning this test can be used) for the duration of the COVID-19 declaration under Section 564(b)(1) of the Act, 21 U.S.C. section 360bbb-3(b)(1), unless the authorization is terminated or revoked sooner.     Influenza A by PCR NEGATIVE NEGATIVE Final   Influenza B by PCR NEGATIVE NEGATIVE Final    Comment:  (NOTE) The Xpert Xpress SARS-CoV-2/FLU/RSV assay is intended as an aid in  the diagnosis of influenza from Nasopharyngeal swab specimens and  should not be used as a sole basis for treatment. Nasal washings and  aspirates are unacceptable for Xpert Xpress SARS-CoV-2/FLU/RSV  testing.  Fact Sheet for Patients: PinkCheek.be  Fact Sheet for Healthcare Providers: GravelBags.it  This test is not yet approved or cleared by the [place] FDA and  has been authorized for detection and/or diagnosis of SARS-CoV-2 by  FDA under an Emergency Use Authorization (EUA). This EUA will remain  in effect (meaning this test can be used) for the duration of the  Covid-19 declaration under Section 564(b)(1) of the Act, 21  U.S.C. section 360bbb-3(b)(1), unless the authorization is  terminated or revoked. Performed at [facility], [place] [address], [place], [place] [address]      Radiological Exams on Admission: DG Chest 1 View  Result Date: 07/10/2020 CLINICAL DATA:  Preoperative evaluation.  Right foot osteomyelitis. EXAM: CHEST  1 VIEW COMPARISON:  None. FINDINGS: The heart size and mediastinal contours are within normal limits. Both lungs are clear. The visualized skeletal structures are unremarkable. IMPRESSION: No active disease. Electronically Signed   By: Davina Poke D.O.   On: 07/10/2020 13:24   DG Foot Complete Right  Result Date: 07/08/2020 CLINICAL DATA:  Big toe infection. Patient is diabetic. No known injury. EXAM: RIGHT FOOT COMPLETE - 3+ VIEW COMPARISON:  None. FINDINGS: May be mild skin breakdown in the distal great toe. There is bony erosion of the distal tuft of the distal first phalanx best seen on oblique imaging. There is a focus of air projected over the soft tissues between the proximal first and second phalanges. No other evidence of osteomyelitis. No fractures. IMPRESSION: 1. Probable mild skin breakdown of  the distal soft tissues of the great toe. Mild erosion in the tuft of the distal first phalanx consistent with osteomyelitis. 2. There is a focus of air projected over the soft tissues between the proximal first and second phalanges. Given the findings of osteomyelitis, this gas could be from a gas-forming organism with resulting soft tissue gas. Electronically Signed   By: Dorise Bullion III M.D   On: 07/08/2020 17:57    EKG: Independently reviewed: Atrial fibrillation at 80 bpm  Assessment/Plan Osteomyelitis of the right great toe with diabetic foot ulcer: Acute.  Patient initially noted wound of his right great toe 2 weeks ago.  X-ray imaging from 8/19 showed signs of osteomyelitis with concern for gas formation.  ESR and CRP currently within normal limits. Patient was initially started on Vanco and Zosyn due to the concern for the formation seen on previous x-rays. -Admit to MedSurg bed -Lower extremity wound order set utilized -Add-on prealbumin and hemoglobin A1c -Continue empiric antibiotics vancomycin and Zosyn -Check ABI with TBI lower extremity -N.p.o. after midnight -Wound care consult -Appreciate orthopedic consultative services we will follow-up for further recommendation  Hypertensive urgency: Acute.  Blood pressures initially elevated up to 196/126.  Home blood pressure medications include Coreg 12.5 mg twice daily and losartan 100 mg daily, but the patient has not been taking these in quite some time. -Counseled the patient on need to take blood pressure medications due to multiple health implications -Restarted home blood pressure medications of Coreg and losartan  Chronic atrial fibrillation on anticoagulation: Patient appears to be rate control though not on any of his home blood pressure medications.  He reportedly has been taking his Coumadin.  However, last INR was noted to be subtherapeutic on 11/8 at 1.5.  Last EF noted to be within normal limits back in 2011. -Check  PT/INR -Hold Coumadin -Start patient on heparin per pharmacy  Lower extremity swelling: Patient with at least 2+ bilateral lower extremity swelling.  Chest x-ray otherwise clear and grossly fluid overloaded. -Strict intake and output -Elevate lower extremity -Give Lasix 20 mg IV x1 dose  Hypokalemia/hyponatremia: Acute.  Potassium 3.4 and potassium 134 on admission. -Give 40 mEq of potassium chloride x1 dose now -Continue to monitor  Diabetes mellitus type 2: At home patient is supposed to be on Metformin 1000 mg twice daily, but he has not taken this in months.  Last hemoglobin A1c unknown. -Follow-up hemoglobin A1c -Hold Metformin -CBGs with sensitive SSI  DVT prophylaxis: Lovenox  Code Status: Full  Family Communication: Wife updated at bedside Disposition Plan: And to be determined Consults called: Orthopedics Admission status: inpatient, likely require more than 2 midnight stay for IV antibiotics and possible surgical intervention  Norval Morton MD [facility] Pager [phone]   If 7PM-7AM, please contact night-coverage [email or web address] Password [email or web address]  07/10/2020, 1:54 PM

## 2020-07-10 NOTE — Progress Notes (Signed)
ANTICOAGULATION CONSULT NOTE  Pharmacy Consult for Heparin Indication: atrial fibrillation  No Known Allergies  Patient Measurements: Height: 6\' 2"  (188 cm) Weight: 74.8 kg (165 lb) IBW/kg (Calculated) : 82.2 Heparin Dosing Weight: 74.8 kg  Vital Signs: Temp: 98.2 F (36.8 C) (11/21 1106) Temp Source: Oral (11/21 1106) BP: 187/112 (11/21 1630) Pulse Rate: 75 (11/21 1630)  Labs: Recent Labs    07/08/20 1737 07/10/20 1139 07/10/20 1637  HGB 14.5 14.3  --   HCT 43.0 42.5  --   PLT 226 237  --   LABPROT  --   --  14.1  INR  --   --  1.1  CREATININE 0.86 0.95  --     Estimated Creatinine Clearance: 74.4 mL/min (by C-G formula based on SCr of 0.95 mg/dL).   Medical History: Past Medical History:  Diagnosis Date  . Atrial fibrillation ([facility])   . Diabetes mellitus without complication ([facility])   . Hyperlipemia   . Hypertension     Medications:  Scheduled:  . atorvastatin  20 mg Oral Daily  . carvedilol  12.5 mg Oral BID  . digoxin  0.25 mg Oral Daily  . heparin  5,000 Units Intravenous Once  . insulin aspart  0-6 Units Subcutaneous TID WC  . losartan  100 mg Oral Daily  . methylPREDNISolone acetate  40 mg Intra-articular Once  . methylPREDNISolone acetate  40 mg Intra-articular Once  . sodium chloride flush  3 mL Intravenous Q12H    Assessment: Patient is a 74 yom that presents to the ED with osteomyelitis of his foot with potential gas. Patient takes warfarin at home for afib. Patient states he took his warfarin yesterday however INR is currently 1.1. Pharmacy has been asked to dose heparin while bridging/holding warfarin awaiting otho's recs.  Goal of Therapy:  INR 2-3 Heparin level 0.3-0.7 units/ml Monitor platelets by anticoagulation protocol: Yes   Plan:  - Heparin bolus 5000 units IV x 1 dose - Heparin drip @ 1100 units/hr - Heparin level in ~ 6 hours  - Monitor patient for s/s of bleeding and CBC while on heparin   07/12/20 PharmD. BCPS   07/10/2020,5:03 PM

## 2020-07-10 NOTE — ED Notes (Signed)
Patient SBP 190's. Physician notified.

## 2020-07-10 NOTE — ED Provider Notes (Signed)
[facility] EMERGENCY DEPARTMENT Provider Note   CSN: [id number] Arrival date & time: 07/10/20  1044     History Chief Complaint  Patient presents with  . Toe Injury    Black Toe    Manuel Turner is a 74 y.o. male.  Who presents emergency department with known osteomyelitis of suspected gas-forming organism of the right great toe.  The patient was seen on 07/08/2020 at bedside or [facility] emergency department with complaint of discoloration of his right great toe and ulceration of the bottom of the right foot.Marland Kitchen  He has a history of diabetes.  He has no pain and denies fevers or chills.  Work-up revealed no leukocytosis.  Patient's right toe x-ray showed skin breakdown of the distal soft tissues of the great toe erosion of the distal tuft of the distal right first phalanx consistent with osteomyelitis and air projected over the soft tissues between the proximal first and second phalanges concerning for gas-forming organism.  The patient refused admission at that time.  He did take 1 bag of antibiotics.  His wife and daughters convinced him to return for admission for osteomyelitis today.  The history is provided by the patient, medical records and the spouse.       Past Medical History:  Diagnosis Date  . Atrial fibrillation (HCC)   . Diabetes mellitus without complication (HCC)   . Hyperlipemia   . Hypertension     Patient Active Problem List   Diagnosis Date Noted  . Osteomyelitis of toe (HCC) 07/10/2020  . Encounter for therapeutic drug monitoring 12/17/2019  . (HFpEF) heart failure with preserved ejection fraction (HCC) 12/07/2014  . Atrial fibrillation, chronic (HCC) 03/16/2014  . Essential hypertension 03/16/2014  . Chronic anticoagulation 03/16/2014  . Bilateral lower extremity edema 03/16/2014    No past surgical history on file.     Family History  Family history unknown: Yes    Social History   Tobacco Use  . Smoking status: Former  Games developer  . Smokeless tobacco: Never Used  Substance Use Topics  . Alcohol use: No  . Drug use: Not on file    Home Medications Prior to Admission medications   Medication Sig Start Date End Date Taking? Authorizing Provider  amoxicillin-clavulanate (AUGMENTIN) 875-125 MG tablet Take 1 tablet by mouth 2 (two) times daily. 07/08/20   Linwood Dibbles, MD  atorvastatin (LIPITOR) 20 MG tablet Take 20 mg by mouth daily.    [provider]  carvedilol (COREG) 12.5 MG tablet Take 1 tablet (12.5 mg total) by mouth 2 (two) times daily. 12/24/19 03/23/20  Georgie Chard D, NP  CINNAMON PO Take 1,000 mg by mouth.    [provider]  diazepam (VALIUM) 5 MG tablet Take 2.5-5 mg by mouth daily as needed for anxiety. 12/28/16   [provider]  digoxin (LANOXIN) 0.125 MG tablet Take 2 tablets (0.25 mg total) by mouth daily. 03/06/18   Lyn Records, MD  ibuprofen (ADVIL,MOTRIN) 600 MG tablet Take 600 mg by mouth every 8 (eight) hours as needed for headache, mild pain or moderate pain.  09/03/18   [provider]  losartan (COZAAR) 100 MG tablet Take 100 mg by mouth daily.    [provider]  meloxicam (MOBIC) 15 MG tablet Take 15 mg by mouth daily.  10/13/18   [provider]  metFORMIN (GLUCOPHAGE) 1000 MG tablet Take 1,000 mg by mouth daily with breakfast.     [provider]  tiZANidine (  ZANAFLEX) 4 MG tablet Take 4 mg by mouth every 8 (eight) hours as needed.  10/13/18   [provider]  warfarin (COUMADIN) 4 MG tablet Take 1 tablet (4 mg total) by mouth daily. 12/17/19   Lyn Records, MD    Allergies    Patient has no known allergies.  Review of Systems   Review of Systems Ten systems reviewed and are negative for acute change, except as noted in the HPI.   Physical Exam Updated Vital Signs BP (!) 196/126   Pulse 97   Temp 98.2 F (36.8 C) (Oral)   Resp 16   Ht 6\' 2"  (1.88 m)   Wt 74.8 kg   SpO2 100%   BMI 21.18 kg/m    Physical Exam Physical Exam  Nursing note and vitals reviewed. Constitutional: He appears well-developed and well-nourished. No distress.  HENT:  Head: Normocephalic and atraumatic.  Eyes: Conjunctivae normal are normal. No scleral icterus.  Neck: Normal range of motion. Neck supple.  Cardiovascular: Normal rate, regular rhythm and normal heart sounds.   Pulmonary/Chest: Effort normal and breath sounds normal. No respiratory distress.  Abdominal: Soft. There is no tenderness.  Musculoskeletal: He exhibits no edema.  Examination of the right foot shows stage II ulceration on the plantar surface at the first MTP joint.  No obvious signs of infection The distal right great toe has black, necrotic tissue with macerated white tissue surrounding.  DP and PT pulses are bounding bilaterally.   Neurological: He is alert.  Skin: Skin is warm and dry. He is not diaphoretic.  Psychiatric: His behavior is normal.    ED Results / Procedures / Treatments   Labs (all labs ordered are listed, but only abnormal results are displayed) Labs Reviewed  COMPREHENSIVE METABOLIC PANEL - Abnormal; Notable for the following components:      Result Value   Sodium 134 (*)    Potassium 3.4 (*)    Glucose, Bld 154 (*)    All other components within normal limits  CBG MONITORING, ED - Abnormal; Notable for the following components:   Glucose-Capillary 148 (*)    All other components within normal limits  RESPIRATORY PANEL BY RT PCR (FLU A&B, COVID)  CULTURE, BLOOD (ROUTINE X 2)  CULTURE, BLOOD (ROUTINE X 2)  CULTURE, BLOOD (ROUTINE X 2)  CULTURE, BLOOD (ROUTINE X 2)  CBC WITH DIFFERENTIAL/PLATELET  LACTIC ACID, PLASMA  SEDIMENTATION RATE  C-REACTIVE PROTEIN  HEMOGLOBIN A1C  HIV ANTIBODY (ROUTINE TESTING W REFLEX)  PREALBUMIN    EKG EKG Interpretation  Date/Time:  Sunday July 10 2020 12:56:07 EST Ventricular Rate:  80 PR Interval:    QRS Duration: 92 QT Interval:  403 QTC  Calculation: 465 R Axis:   -56 Text Interpretation: Atrial fibrillation LAD, consider left anterior fascicular block Probable anteroseptal infarct, old No old tracing to compare Confirmed by 04-10-1986 [phone]) on 07/10/2020 1:03:33 PM   Radiology DG Chest 1 View  Result Date: 07/10/2020 CLINICAL DATA:  Preoperative evaluation.  Right foot osteomyelitis. EXAM: CHEST  1 VIEW COMPARISON:  None. FINDINGS: The heart size and mediastinal contours are within normal limits. Both lungs are clear. The visualized skeletal structures are unremarkable. IMPRESSION: No active disease. Electronically Signed   By: 07/12/2020 D.O.   On: 07/10/2020 13:24   DG Foot Complete Right  Result Date: 07/08/2020 CLINICAL DATA:  Big toe infection. Patient is diabetic. No known injury. EXAM: RIGHT FOOT COMPLETE - 3+ VIEW COMPARISON:  None. FINDINGS: May  be mild skin breakdown in the distal great toe. There is bony erosion of the distal tuft of the distal first phalanx best seen on oblique imaging. There is a focus of air projected over the soft tissues between the proximal first and second phalanges. No other evidence of osteomyelitis. No fractures. IMPRESSION: 1. Probable mild skin breakdown of the distal soft tissues of the great toe. Mild erosion in the tuft of the distal first phalanx consistent with osteomyelitis. 2. There is a focus of air projected over the soft tissues between the proximal first and second phalanges. Given the findings of osteomyelitis, this gas could be from a gas-forming organism with resulting soft tissue gas. Electronically Signed   By: Gerome Sam III M.D   On: 07/08/2020 17:57    Procedures Procedures (including critical care time)  Medications Ordered in ED Medications  vancomycin (VANCOREADY) IVPB 1500 mg/300 mL (1,500 mg Intravenous New Bag/Given 07/10/20 1306)    Followed by  vancomycin (VANCOREADY) IVPB 1250 mg/250 mL (has no administration in time range)   piperacillin-tazobactam (ZOSYN) IVPB 3.375 g (has no administration in time range)  enoxaparin (LOVENOX) injection 40 mg (has no administration in time range)  sodium chloride flush (NS) 0.9 % injection 3 mL (3 mLs Intravenous Given 07/10/20 1423)  acetaminophen (TYLENOL) tablet 650 mg (has no administration in time range)    Or  acetaminophen (TYLENOL) suppository 650 mg (has no administration in time range)  ondansetron (ZOFRAN) tablet 4 mg (has no administration in time range)    Or  ondansetron (ZOFRAN) injection 4 mg (has no administration in time range)  albuterol (PROVENTIL) (2.5 MG/3ML) 0.083% nebulizer solution 2.5 mg (has no administration in time range)  metoprolol tartrate (LOPRESSOR) injection 5 mg (has no administration in time range)  piperacillin-tazobactam (ZOSYN) IVPB 3.375 g (0 g Intravenous Stopped 07/10/20 1313)    ED Course  I have reviewed the triage vital signs and the nursing notes.  Pertinent labs & imaging results that were available during my care of the patient were reviewed by me and considered in my medical decision making (see chart for details).    MDM Rules/Calculators/A&P                          CC:.toe discoloration VS: BP (!) 196/126   Pulse 97   Temp 98.2 F (36.8 C) (Oral)   Resp 16   Ht 6\' 2"  (1.88 m)   Wt 74.8 kg   SpO2 100%   BMI 21.18 kg/m   is gathered by patient and wife,emr. Previous records obtained and reviewed. DDX:The patient's complaint of toe discoloration involves an extensive number of diagnostic and treatment options, and is a complaint that carries with it a high risk of complications, morbidity, and potential mortality. Given the large differential diagnosis, medical decision making is of high complexity. Bruising, necrosis, cellulitis, osteomyelitis, necrotizing fasciitis. Labs: I ordered reviewed and interpreted labs which include  CBC which shows no elevated white blood cell count or other abnormality, see  MP shows mildly elevated blood glucose, Covid panel negative, sed rate not elevated, CRP not elevated as well, no lactic acidosis, blood cultures pending.  Imaging:.  I ordered and reviewed images of the 1 view chest x-ray which showed no acute abnormalities KX:FGHWEXH controlled afib at 80 Consults:Dr. BZJ:IRCV, who will have [facility] consult in the morning [facility] will be admitted for osteomyelitis of the great toe Patient disposition:The patient appears reasonably screened and/or  stabilized for discharge and I doubt any other medical condition or other [facility] requiring further screening, evaluation, or treatment in the ED at this time prior to discharge. I have discussed lab and/or imaging findings with the patient and answered all questions/concerns to the best of my ability.I have discussed return precautions and OP follow up.    Final Clinical Impression(s) / ED Diagnoses Final diagnoses:  Osteomyelitis of right foot, unspecified type (HCC)  Hypertension, unspecified type    Rx / DC Orders ED Discharge Orders    None       Arthor CaptainHarris, Arkeem Harts, PA-C 07/10/20 1433    Pricilla LovelessGoldston, Scott, MD 07/11/20 [phone]

## 2020-07-10 NOTE — ED Triage Notes (Signed)
Patient presents with black toe that began about 2 weeks ago that started gradually and began to get worse with time. Patient denies pain.

## 2020-07-11 ENCOUNTER — Other Ambulatory Visit: Payer: Self-pay | Admitting: Physician Assistant

## 2020-07-11 ENCOUNTER — Inpatient Hospital Stay (HOSPITAL_COMMUNITY): Payer: Medicare HMO

## 2020-07-11 ENCOUNTER — Encounter (HOSPITAL_COMMUNITY): Payer: Self-pay | Admitting: Internal Medicine

## 2020-07-11 DIAGNOSIS — M869 Osteomyelitis, unspecified: Secondary | ICD-10-CM | POA: Diagnosis not present

## 2020-07-11 DIAGNOSIS — L0390 Cellulitis, unspecified: Secondary | ICD-10-CM | POA: Diagnosis not present

## 2020-07-11 DIAGNOSIS — E11621 Type 2 diabetes mellitus with foot ulcer: Secondary | ICD-10-CM | POA: Diagnosis not present

## 2020-07-11 DIAGNOSIS — L97509 Non-pressure chronic ulcer of other part of unspecified foot with unspecified severity: Secondary | ICD-10-CM

## 2020-07-11 LAB — BASIC METABOLIC PANEL
Anion gap: 14 (ref 5–15)
BUN: 15 mg/dL (ref 8–23)
CO2: 22 mmol/L (ref 22–32)
Calcium: 9.1 mg/dL (ref 8.9–10.3)
Chloride: 99 mmol/L (ref 98–111)
Creatinine, Ser: 0.84 mg/dL (ref 0.61–1.24)
GFR, Estimated: >60 mL/min (ref >60)
Glucose, Bld: 140 mg/dL — ABNORMAL HIGH (ref 70–99)
Potassium: 3.5 mmol/L (ref 3.5–5.1)
Sodium: 135 mmol/L (ref 135–145)

## 2020-07-11 LAB — CBC
HCT: 41 % (ref 39.0–52.0)
Hemoglobin: 13.9 g/dL (ref 13.0–17.0)
MCH: 29.8 pg (ref 26.0–34.0)
MCHC: 33.9 g/dL (ref 30.0–36.0)
MCV: 88 fL (ref 80.0–100.0)
Platelets: 204 10*3/uL (ref 150–400)
RBC: 4.66 MIL/uL (ref 4.22–5.81)
RDW: 12.1 % (ref 11.5–15.5)
WBC: 6.6 10*3/uL (ref 4.0–10.5)
nRBC: 0 % (ref 0.0–0.2)

## 2020-07-11 LAB — MAGNESIUM: Magnesium: 1.7 mg/dL (ref 1.7–2.4)

## 2020-07-11 LAB — GLUCOSE, CAPILLARY
Glucose-Capillary: 146 mg/dL — ABNORMAL HIGH (ref 70–99)
Glucose-Capillary: 140 mg/dL — ABNORMAL HIGH (ref 70–99)
Glucose-Capillary: 115 mg/dL — ABNORMAL HIGH (ref 70–99)
Glucose-Capillary: 147 mg/dL — ABNORMAL HIGH (ref 70–99)

## 2020-07-11 LAB — HEPARIN LEVEL (UNFRACTIONATED)
Heparin Unfractionated: 0.15 IU/mL — ABNORMAL LOW (ref 0.30–0.70)
Heparin Unfractionated: 0.12 IU/mL — ABNORMAL LOW (ref 0.30–0.70)
Heparin Unfractionated: 0.22 IU/mL — ABNORMAL LOW (ref 0.30–0.70)

## 2020-07-11 LAB — SURGICAL PCR SCREEN
MRSA, PCR: NEGATIVE
Staphylococcus aureus: POSITIVE — AB

## 2020-07-11 MED ORDER — HYDRALAZINE HCL 25 MG PO TABS: 25.0000 mg | ORAL_TABLET | Freq: Three times a day (TID) | ORAL | Status: DC

## 2020-07-11 MED ORDER — HYDRALAZINE HCL 25 MG PO TABS
25.0000 mg | ORAL_TABLET | Freq: Four times a day (QID) | ORAL | Status: DC | PRN
  Administered 2020-07-14: 25 mg via ORAL
  Filled 2020-07-11: qty 1

## 2020-07-11 MED ORDER — AMLODIPINE BESYLATE 5 MG PO TABS
5.0000 mg | ORAL_TABLET | Freq: Every day | ORAL | Status: DC
  Administered 2020-07-11 – 2020-07-15 (×4): 5 mg via ORAL
  Filled 2020-07-11 (×4): qty 1

## 2020-07-11 NOTE — Consult Note (Signed)
ORTHOPAEDIC CONSULTATION  REQUESTING PHYSICIAN: Rolly Salter, MD  Chief Complaint: Chronic ulcer right great toe.  HPI: Manuel Turner is a 74 y.o. male who presents with chronic ulceration tip of the right great toe with ischemic changes and a Wagner grade 1 ulcer beneath the first metatarsal head.  Patient has diabetic insensate neuropathy.  Past Medical History:  Diagnosis Date  . Atrial fibrillation (HCC)   . Diabetes mellitus without complication (HCC)   . Hyperlipemia   . Hypertension    History reviewed. No pertinent surgical history. Social History   Socioeconomic History  . Marital status: Married    Spouse name: Not on file  . Number of children: Not on file  . Years of education: Not on file  . Highest education level: Not on file  Occupational History  . Not on file  Tobacco Use  . Smoking status: Former Games developer  . Smokeless tobacco: Never Used  Substance and Sexual Activity  . Alcohol use: No  . Drug use: Not on file  . Sexual activity: Not on file  Other Topics Concern  . Not on file  Social History Narrative  . Not on file   Social Determinants of Health   Financial Resource Strain:   . Difficulty of Paying Living Expenses: Not on file  Food Insecurity:   . Worried About Programme researcher, broadcasting/film/video in the Last Year: Not on file  . Ran Out of Food in the Last Year: Not on file  Transportation Needs:   . Lack of Transportation (Medical): Not on file  . Lack of Transportation (Non-Medical): Not on file  Physical Activity:   . Days of Exercise per Week: Not on file  . Minutes of Exercise per Session: Not on file  Stress:   . Feeling of Stress : Not on file  Social Connections:   . Frequency of Communication with Friends and Family: Not on file  . Frequency of Social Gatherings with Friends and Family: Not on file  . Attends Religious Services: Not on file  . Active Member of Clubs or Organizations: Not on file  . Attends Banker  Meetings: Not on file  . Marital Status: Not on file   Family History  Family history unknown: Yes   - negative except otherwise stated in the family history section No Known Allergies Prior to Admission medications   Medication Sig Start Date End Date Taking? Authorizing Provider  atorvastatin (LIPITOR) 20 MG tablet Take 20 mg by mouth daily.   Yes [provider]  carvedilol (COREG) 12.5 MG tablet Take 1 tablet (12.5 mg total) by mouth 2 (two) times daily. 12/24/19 07/10/20 Yes Georgie Chard D, NP  diazepam (VALIUM) 5 MG tablet Take 2.5-5 mg by mouth as needed for anxiety.  12/28/16  Yes [provider]  digoxin (LANOXIN) 0.125 MG tablet Take 2 tablets (0.25 mg total) by mouth daily. 03/06/18  Yes Lyn Records, MD  losartan (COZAAR) 100 MG tablet Take 100 mg by mouth daily.   Yes [provider]  meloxicam (MOBIC) 15 MG tablet Take 15 mg by mouth daily.  10/13/18  Yes [provider]  metFORMIN (GLUCOPHAGE) 1000 MG tablet Take 1,000 mg by mouth daily with breakfast.    Yes [provider]  tiZANidine (ZANAFLEX) 4 MG tablet Take 4 mg by mouth every 8 (eight) hours as needed for muscle spasms.  10/13/18  Yes [provider]  warfarin (COUMADIN) 4 MG tablet  Take 1 tablet (4 mg total) by mouth daily. Patient taking differently: Take 4-6 mg by mouth See admin instructions. 6 mg (4 mg x 1.5) every Mon, Thu; 4 mg (4 mg x 1) all other days. 12/17/19  Yes Lyn Records, MD  amoxicillin-clavulanate (AUGMENTIN) 875-125 MG tablet Take 1 tablet by mouth 2 (two) times daily. Patient not taking: Reported on 07/10/2020 07/08/20   Linwood Dibbles, MD   DG Chest 1 View  Result Date: 07/10/2020 CLINICAL DATA:  Preoperative evaluation.  Right foot osteomyelitis. EXAM: CHEST  1 VIEW COMPARISON:  None. FINDINGS: The heart size and mediastinal contours are within normal limits. Both lungs are clear. The visualized skeletal structures are unremarkable. IMPRESSION: No  active disease. Electronically Signed   By: Duanne Guess D.O.   On: 07/10/2020 13:24   - pertinent xrays, CT, MRI studies were reviewed and independently interpreted  Positive ROS: All other systems have been reviewed and were otherwise negative with the exception of those mentioned in the HPI and as above.  Physical Exam: General: Alert, no acute distress Psychiatric: Patient is competent for consent with normal mood and affect Lymphatic: No axillary or cervical lymphadenopathy Cardiovascular: No pedal edema Respiratory: No cyanosis, no use of accessory musculature GI: No organomegaly, abdomen is soft and non-tender    Images:  @ENCIMAGES @  Labs:  Lab Results  Component Value Date   HGBA1C 7.0 (H) 07/10/2020   ESRSEDRATE 12 07/10/2020   ESRSEDRATE 22 (H) 07/08/2020   CRP 0.5 07/10/2020    Lab Results  Component Value Date   ALBUMIN 3.8 07/10/2020   PREALBUMIN 15.6 (L) 07/10/2020    Neurologic: Patient does not have protective sensation bilateral lower extremities.   MUSCULOSKELETAL:   Skin: Examination patient has an ischemic ulcer over the tip of the right great toe 1 cm in diameter.  There is fibrinous tissue at the base.  Patient also has a Wagner grade 1 ulcer beneath the first metatarsal head which is 1 cm diameter.  Patient has a strong dorsalis pedis pulse with venous stasis swelling.  Review of the radiographs shows lytic changes of the tuft of the right great toe consistent with osteomyelitis.  Patient's prealbumin is 15.6 his hemoglobin A1c is 7.0.  Assessment: Assessment: Diabetic insensate neuropathy with osteomyelitis of the right great toe.  Plan: I discussed with the patient and his daughter at bedside recommendation to proceed with amputation of the great toe.  Discussed the risks of surgery including difficulty with wound healing versus risk of nonoperative treatment with risk of further foot infection and long-term antibiotics and potential  risk from the long-term antibiotics.  Patient states he understands he would like to discuss this with his wife and daughter will reevaluate the patient tomorrow and if the patient agrees with plan for surgery on Wednesday.  Thank you for the consult and the opportunity to see Mr. Shirl Ludington, MD [facility] [phone] 7:47 AM

## 2020-07-11 NOTE — Progress Notes (Signed)
ABI has been completed.   Preliminary results in CV Proc.   Blanch Media 07/11/2020 9:20 AM

## 2020-07-11 NOTE — TOC Initial Note (Addendum)
Transition of Care [facility]) - Initial/Assessment Note    Patient Details  Name: Manuel Turner MRN: [id number] Date of Birth: 02-03-1946  Transition of Care [facility]) CM/SW Contact:    Epifanio Lesches, RN Phone Number: 07/11/2020, 9:11 AM  Clinical Narrative:                 Presents with osteomyelitis of the right great toe with diabetic foot ulcer. Hx of chronic atrial fibrillation on Coumadin, hypertension, hyperlipidemia, and diabetes mellitus type 2 . From home with wife. PTA independent with ADL's.   Per MD not medically ready .... ortho to see, ? Vascular to see. Pt receiving IV ABX therapy.....  TOC team monitoring and following for needs.   07/11/2020 @ 0926 Plan: Ortho to  reevaluate the patient tomorrow and if the patient agrees plan for surgery on Wednesday  Expected Discharge Plan: Home/Self Care Barriers to Discharge: Continued Medical Work up   Patient Goals and CMS Choice        Expected Discharge Plan and Services Expected Discharge Plan: Home/Self Care                                              Prior Living Arrangements/Services                       Activities of Daily Living Home Assistive Devices/Equipment: None ADL Screening (condition at time of admission) Patient's cognitive ability adequate to safely complete daily activities?: No Is the patient deaf or have difficulty hearing?: No Does the patient have difficulty seeing, even when wearing glasses/contacts?: No Does the patient have difficulty concentrating, remembering, or making decisions?: No Patient able to express need for assistance with ADLs?: Yes Does the patient have difficulty dressing or bathing?: No Independently performs ADLs?: Yes (appropriate for developmental age) Does the patient have difficulty walking or climbing stairs?: No Weakness of Legs: Right Weakness of Arms/Hands: None  Permission Sought/Granted                  Emotional Assessment               Admission diagnosis:  Osteomyelitis of toe (HCC) [M86.9] Osteomyelitis of right foot, unspecified type (HCC) [M86.9] Hypertension, unspecified type [I10] Patient Active Problem List   Diagnosis Date Noted  . Osteomyelitis of right foot (HCC)   . Osteomyelitis of toe (HCC) 07/10/2020  . Hypertensive urgency 07/10/2020  . Hypokalemia 07/10/2020  . Hyponatremia 07/10/2020  . Type 2 diabetes mellitus with foot ulcer (HCC) 07/10/2020  . Encounter for therapeutic drug monitoring 12/17/2019  . (HFpEF) heart failure with preserved ejection fraction (HCC) 12/07/2014  . Atrial fibrillation, chronic (HCC) 03/16/2014  . Essential hypertension 03/16/2014  . Chronic anticoagulation 03/16/2014  . Bilateral lower extremity edema 03/16/2014   PCP:  Jarrett Soho, PA-C Pharmacy:   [facility] [phone] - [place], [place] - [address] [place] [address] Phone: [phone] Fax: [phone]  [facility] [address] [place] - [address] [place] [address] Phone: [phone] Fax: [phone]  [facility] - [place], [place] - [address] [place] [address] Phone: [phone] Fax: [phone]     Social Determinants of Health (SDOH) Interventions    Readmission Risk Interventions No flowsheet data found.

## 2020-07-11 NOTE — Plan of Care (Signed)

## 2020-07-11 NOTE — Progress Notes (Signed)
[facility] Progress Note  Patient: Manuel Turner    RWE:[id number]  DOA: 07/10/2020     Date of Service: the patient was seen and examined on 07/11/2020  Brief hospital course: Past medical history of chronic A. fib on Coumadin, HTN, HLD, type II DM.  Presents with nonhealing wound of the right great toe.  Currently plan is continue antibiotics and amputation on Wednesday.  Assessment and Plan: Osteomyelitis of the right great toe with diabetic foot ulcer: Acute.  Patient initially noted wound of his right great toe 2 weeks ago.  X-ray imaging from 8/19 showed signs of osteomyelitis with concern for gas formation.  ESR and CRP currently within normal limits. Patient was initially started on Vanco and Zosyn due to the concern for the formation seen on previous x-rays. -Lower extremity wound order set utilized -Add-on prealbumin and hemoglobin A1c -Continue empiric antibiotics vancomycin and Zosyn -Appreciate orthopedic consultative services we will follow-up for further recommendation  Hypertensive urgency: Acute.   Blood pressures initially elevated up to 196/126.  Home blood pressure medications include Coreg 12.5 mg twice daily and losartan 100 mg daily, but the patient has not been taking these in quite some time. -Counseled the patient on need to take blood pressure medications due to multiple health implications -Restarted home blood pressure medications of Coreg and losartan, added hydralazine as needed.  Chronic atrial fibrillation on anticoagulation: Patient appears to be rate control though not on any of his home blood pressure medications.  He reportedly has been taking his Coumadin.  However, last INR was noted to be subtherapeutic on 11/8 at 1.5.  Last EF noted to be within normal limits back in 2011. -Hold Coumadin on heparin per pharmacy  Lower extremity swelling: Patient with at least 2+ bilateral lower extremity swelling.  Chest x-ray otherwise clear and grossly  fluid overloaded. -Strict intake and output -Elevate lower extremity  Hypokalemia/hyponatremia: Acute.  Potassium 3.4 and potassium 134 on admission. Resolved. -Continue to monitor  Diabetes mellitus type 2: At home patient is supposed to be on Metformin 1000 mg twice daily, but he has not taken this in months.  Last hemoglobin A1c unknown. -Follow-up hemoglobin A1c -Hold Metformin -CBGs with sensitive SSI  Body mass index is 21.18 kg/m.    Interventions:        Diet: Regular diet DVT Prophylaxis: Therapeutic heparin     Advance goals of care discussion: Full code  Family Communication: family was present at bedside, at the time of interview.  The pt provided permission to discuss medical plan with the family. Opportunity was given to ask question and all questions were answered satisfactorily.   Disposition:  Status is: Inpatient  Remains inpatient appropriate because:Ongoing diagnostic testing needed not appropriate for outpatient work up   Dispo: The patient is from: Home              Anticipated d/c is to: Home              Anticipated d/c date is: > 3 days              Patient currently is not medically stable to d/c.  Subjective: Pain well controlled.  No nausea no vomiting but no fever no chills.  No chest pain.  No bleeding.  Physical Exam:  General: Appear in mild distress, no Rash; Oral Mucosa Clear, moist. no Abnormal Neck Mass Or lumps, Conjunctiva normal  Cardiovascular: S1 and S2 Present, no Murmur, Respiratory: good respiratory effort, Bilateral Air  entry present and CTA, no Crackles, no wheezes Abdomen: Bowel Sound present, Soft and no tenderness Extremities: right Pedal edema Neurology: alert and oriented to time, place, and person affect appropriate. no new focal deficit Gait not checked due to patient safety concerns  Vitals:   07/11/20 0805 07/11/20 1210 07/11/20 1620 07/11/20 1803  BP: (!) 183/110 (!) 168/92 (!) 165/85 (!) 157/110    Pulse: 91 95 90 73  Resp: $Remo'17 18 19   'ukXvS$ Temp: 98.4 F (36.9 C) 98.2 F (36.8 C) 98.1 F (36.7 C)   TempSrc: Oral Oral Oral   SpO2: 100% 100% 100%   Weight:      Height:        Intake/Output Summary (Last 24 hours) at 07/11/2020 1946 Last data filed at 07/11/2020 1802 Gross per 24 hour  Intake 1065.53 ml  Output 1800 ml  Net -734.47 ml   Filed Weights   07/10/20 1108  Weight: 74.8 kg    Data Reviewed: I have personally reviewed and interpreted daily labs, tele strips, imagings as discussed above. I reviewed all nursing notes, pharmacy notes, vitals, pertinent old records I have discussed plan of care as described above with RN and patient/family.  CBC: Recent Labs  Lab 07/08/20 1737 07/10/20 1139 07/11/20 0441  WBC 6.6 6.6 6.6  NEUTROABS 4.5 5.0  --   HGB 14.5 14.3 13.9  HCT 43.0 42.5 41.0  MCV 88.5 89.3 88.0  PLT 226 237 694   Basic Metabolic Panel: Recent Labs  Lab 07/08/20 1737 07/10/20 1139 07/11/20 0441  NA 134* 134* 135  K 3.2* 3.4* 3.5  CL 99 99 99  CO2 $Re'26 25 22  'Hev$ GLUCOSE 136* 154* 140*  BUN $Re'14 18 15  'jpq$ CREATININE 0.86 0.95 0.84  CALCIUM 9.0 9.2 9.1  MG  --   --  1.7    Studies: VAS [place] ABI WITH/WO TBI  Result Date: 07/11/2020 LOWER EXTREMITY DOPPLER STUDY Indications: Ulceration. High Risk Factors: Hypertension, hyperlipidemia, Diabetes.  Limitations: Today's exam was limited due to bandages. Comparison Study: no prior Performing Technologist: Abram Sander RVS  Examination Guidelines: A complete evaluation includes at minimum, Doppler waveform signals and systolic blood pressure reading at the level of bilateral brachial, anterior tibial, and posterior tibial arteries, when vessel segments are accessible. Bilateral testing is considered an integral part of a complete examination. Photoelectric Plethysmograph (PPG) waveforms and toe systolic pressure readings are included as required and additional duplex testing as needed. Limited examinations for  reoccurring indications may be performed as noted.  ABI Findings: +--------+------------------+-----+---------+--------+  Right    Rt Pressure (mmHg) Index Waveform  Comment   +--------+------------------+-----+---------+--------+  Brachial 170                      triphasic           +--------+------------------+-----+---------+--------+  PTA      250                1.30  triphasic           +--------+------------------+-----+---------+--------+  DP       246                1.28  triphasic           +--------+------------------+-----+---------+--------+ +--------+------------------+-----+---------+-------+  Left     Lt Pressure (mmHg) Index Waveform  Comment  +--------+------------------+-----+---------+-------+  Brachial 192  triphasic          +--------+------------------+-----+---------+-------+  PTA      244                1.27  triphasic          +--------+------------------+-----+---------+-------+  DP       250                1.30  triphasic          +--------+------------------+-----+---------+-------+ +-------+-----------+-----------+------------+------------+  ABI/TBI Today's ABI Today's TBI Previous ABI Previous TBI  +-------+-----------+-----------+------------+------------+  Right   1.30        bandages                               +-------+-----------+-----------+------------+------------+  Left    1.30                                               +-------+-----------+-----------+------------+------------+  Summary: Right: Resting right ankle-brachial index is within normal range. No evidence of significant right lower extremity arterial disease. ABIs may be unreliable. Left: Resting left ankle-brachial index is within normal range. No evidence of significant left lower extremity arterial disease. ABIs may be unreliable.  *See table(s) above for measurements and observations.  Electronically signed by Monica Martinez MD on 07/11/2020 at 4:40:34 PM.   Final     Scheduled  Meds:  amLODipine  5 mg Oral Daily   atorvastatin  20 mg Oral Daily   carvedilol  12.5 mg Oral BID   insulin aspart  0-6 Units Subcutaneous TID WC   losartan  100 mg Oral Daily   sodium chloride flush  3 mL Intravenous Q12H   Continuous Infusions:  heparin 1,500 Units/hr (07/11/20 1802)   piperacillin-tazobactam (ZOSYN)  IV 3.375 g (07/11/20 1507)   vancomycin 1,250 mg (07/11/20 1222)   PRN Meds: acetaminophen **OR** acetaminophen, albuterol, diazepam, hydrALAZINE, metoprolol tartrate, ondansetron **OR** ondansetron (ZOFRAN) IV  Time spent: 35 minutes  Author: Berle Mull, MD [facility] 07/11/2020 7:46 PM  To reach On-call, see care teams to locate the attending and reach out via [email or web address]. Between 7PM-7AM, please contact night-coverage If you still have difficulty reaching the attending provider, please page the [facility] (Director on Call) for [facility] on amion for assistance.

## 2020-07-11 NOTE — Progress Notes (Signed)
ANTICOAGULATION CONSULT NOTE  Pharmacy Consult for Heparin Indication: atrial fibrillation  No Known Allergies  Patient Measurements: Height: 6\' 2"  (188 cm) Weight: 74.8 kg (165 lb) IBW/kg (Calculated) : 82.2 Heparin Dosing Weight: 74.8 kg  Vital Signs: Temp: 98.1 F (36.7 C) (11/22 2047) Temp Source: Oral (11/22 2047) BP: 150/78 (11/22 2047) Pulse Rate: 59 (11/22 2047)  Labs: Recent Labs    07/10/20 1139 07/10/20 1637 07/11/20 0441 07/11/20 1235 07/11/20 2051  HGB 14.3  --  13.9  --   --   HCT 42.5  --  41.0  --   --   PLT 237  --  204  --   --   LABPROT  --  14.1  --   --   --   INR  --  1.1  --   --   --   HEPARINUNFRC  --   --  0.15* 0.12* 0.22*  CREATININE 0.95  --  0.84  --   --     Estimated Creatinine Clearance: 84.1 mL/min (by C-G formula based on SCr of 0.84 mg/dL).   Medical History: Past Medical History:  Diagnosis Date  . Atrial fibrillation (HCC)   . Diabetes mellitus without complication (HCC)   . Hyperlipemia   . Hypertension    Assessment: 74 yr old male who presented to the ED with osteomyelitis of his foot with potential gas. Patient takes warfarin at home for afib. Patient states he took his warfarin yesterday; however INR is currently 1.1. Pharmacy has been asked to dose heparin while bridging/holding warfarin awaiting ortho procedure.  Amputation is planned for Wednesday, 11/23.  Warfarin PTA:  6 mg on Mon/Thurs, 4 mg on all other days   Heparin level earlier today was subtherapeutic (0.12 units/ml); heparin infusion was increased to 1500 units/hr ~15:15 PM, but infusion rate was not adjusted up to 1500 units/hr until 18:02 PM (night RN said that there may have been an issue with IV earlier today). Heparin level drawn ~3 hrs after heparin infusion rate was increased to 1500 units/hr was 0.23 units/ml, which is below the goal range for this pt, although drawn only 3 hrs after rate change. H/H, platelets WNL. Per RN, no current issues with IV  and no bleeding observed.  Goal of Therapy:  INR 2-3 Heparin level 0.3-0.7 units/ml Monitor platelets by anticoagulation protocol: Yes   Plan:  Increased heparin infusion to 1600 units/hr (increasing by ~1.3 units/kg/hr since heparin level drawn only 3 hrs after last heparin infusion rate change) Check heparin level in 6 hrs Monitor daily heparin level, CBC Monitor for signs/symptoms of bleeding  F/U restarting warfarin after procedure  12/23, PharmD, BCPS, [facility] Clinical Pharmacist 07/11/20, 22:05 PM

## 2020-07-11 NOTE — Progress Notes (Signed)
ANTICOAGULATION CONSULT NOTE  Pharmacy Consult for Heparin Indication: atrial fibrillation  No Known Allergies  Patient Measurements: Height: 6\' 2"  (188 cm) Weight: 74.8 kg (165 lb) IBW/kg (Calculated) : 82.2 Heparin Dosing Weight: 74.8 kg  Vital Signs: Temp: 98.2 F (36.8 C) (11/22 1210) Temp Source: Oral (11/22 1210) BP: 168/92 (11/22 1210) Pulse Rate: 95 (11/22 1210)  Labs: Recent Labs    07/08/20 1737 07/08/20 1737 07/10/20 1139 07/10/20 1637 07/11/20 0441 07/11/20 1235  HGB 14.5   < > 14.3  --  13.9  --   HCT 43.0  --  42.5  --  41.0  --   PLT 226  --  237  --  204  --   LABPROT  --   --   --  14.1  --   --   INR  --   --   --  1.1  --   --   HEPARINUNFRC  --   --   --   --  0.15* 0.12*  CREATININE 0.86  --  0.95  --  0.84  --    < > = values in this interval not displayed.    Estimated Creatinine Clearance: 84.1 mL/min (by C-G formula based on SCr of 0.84 mg/dL).   Medical History: Past Medical History:  Diagnosis Date  . Atrial fibrillation (HCC)   . Diabetes mellitus without complication (HCC)   . Hyperlipemia   . Hypertension     Medications:  Scheduled:  . amLODipine  5 mg Oral Daily  . atorvastatin  20 mg Oral Daily  . carvedilol  12.5 mg Oral BID  . insulin aspart  0-6 Units Subcutaneous TID WC  . losartan  100 mg Oral Daily  . sodium chloride flush  3 mL Intravenous Q12H    Assessment: Patient is a 74 yom that presents to the ED with osteomyelitis of his foot with potential gas. Patient takes warfarin at home for afib. Patient states he took his warfarin yesterday however INR is currently 1.1. Pharmacy has been asked to dose heparin while bridging/holding warfarin awaiting ortho procedure.  Second HL 0.12 is subtherapeutic. H/H, plt stable.   Warfarin PTA 6mg  Mon/Thr, 4mg  All other days   Goal of Therapy:  INR 2-3 Heparin level 0.3-0.7 units/ml Monitor platelets by anticoagulation protocol: Yes   Plan:  Incr heparin to 1500  units/hr F/u 6hr HL  Monitor daily HL, CBC/plt Monitor for signs/symptoms of bleeding  F/u restart warfarin after procedure  07/13/20, PharmD, BCPS, [facility] Clinical Pharmacist  Please check AMION for all [facility] Pharmacy phone numbers After 10:00 PM, call Main Pharmacy [phone]

## 2020-07-11 NOTE — Discharge Instructions (Addendum)

## 2020-07-11 NOTE — Progress Notes (Signed)
ANTICOAGULATION CONSULT NOTE  Pharmacy Consult for Heparin Indication: atrial fibrillation  No Known Allergies  Patient Measurements: Height: 6\' 2"  (188 cm) Weight: 74.8 kg (165 lb) IBW/kg (Calculated) : 82.2 Heparin Dosing Weight: 74.8 kg  Vital Signs: Temp: 98 F (36.7 C) (11/22 0154) Temp Source: Oral (11/22 0154) BP: 160/95 (11/22 0154) Pulse Rate: 64 (11/22 0154)  Labs: Recent Labs    07/08/20 1737 07/08/20 1737 07/10/20 1139 07/10/20 1637 07/11/20 0441  HGB 14.5   < > 14.3  --  13.9  HCT 43.0  --  42.5  --  41.0  PLT 226  --  237  --  204  LABPROT  --   --   --  14.1  --   INR  --   --   --  1.1  --   HEPARINUNFRC  --   --   --   --  0.15*  CREATININE 0.86  --  0.95  --  0.84   < > = values in this interval not displayed.    Estimated Creatinine Clearance: 84.1 mL/min (by C-G formula based on SCr of 0.84 mg/dL).   Medical History: Past Medical History:  Diagnosis Date  . Atrial fibrillation (HCC)   . Diabetes mellitus without complication (HCC)   . Hyperlipemia   . Hypertension     Medications:  Scheduled:  . atorvastatin  20 mg Oral Daily  . carvedilol  12.5 mg Oral BID  . digoxin  0.25 mg Oral Daily  . insulin aspart  0-6 Units Subcutaneous TID WC  . losartan  100 mg Oral Daily  . sodium chloride flush  3 mL Intravenous Q12H    Assessment: Patient is a 74 yom that presents to the ED with osteomyelitis of his foot with potential gas. Patient takes warfarin at home for afib. Patient states he took his warfarin yesterday however INR is currently 1.1. Pharmacy has been asked to dose heparin while bridging/holding warfarin awaiting otho's recs.  11/22 AM update:  Heparin level low CBC good  Goal of Therapy:  INR 2-3 Heparin level 0.3-0.7 units/ml Monitor platelets by anticoagulation protocol: Yes   Plan:  -Inc heparin to 1300 units/hr -1400 heparin level  12/22, PharmD, BCPS Clinical Pharmacist Phone: [phone]

## 2020-07-12 DIAGNOSIS — M869 Osteomyelitis, unspecified: Secondary | ICD-10-CM | POA: Diagnosis not present

## 2020-07-12 LAB — CBC
HCT: 43.8 % (ref 39.0–52.0)
Hemoglobin: 14.6 g/dL (ref 13.0–17.0)
MCH: 30 pg (ref 26.0–34.0)
MCHC: 33.3 g/dL (ref 30.0–36.0)
MCV: 90.1 fL (ref 80.0–100.0)
Platelets: 215 10*3/uL (ref 150–400)
RBC: 4.86 MIL/uL (ref 4.22–5.81)
RDW: 12.2 % (ref 11.5–15.5)
WBC: 6.8 10*3/uL (ref 4.0–10.5)
nRBC: 0 % (ref 0.0–0.2)

## 2020-07-12 LAB — GLUCOSE, CAPILLARY
Glucose-Capillary: 234 mg/dL — ABNORMAL HIGH (ref 70–99)
Glucose-Capillary: 174 mg/dL — ABNORMAL HIGH (ref 70–99)
Glucose-Capillary: 106 mg/dL — ABNORMAL HIGH (ref 70–99)
Glucose-Capillary: 205 mg/dL — ABNORMAL HIGH (ref 70–99)

## 2020-07-12 LAB — HEPARIN LEVEL (UNFRACTIONATED): Heparin Unfractionated: 0.35 IU/mL (ref 0.30–0.70)

## 2020-07-12 LAB — PROTIME-INR
INR: 1.2 (ref 0.8–1.2)
Prothrombin Time: 14.3 seconds (ref 11.4–15.2)

## 2020-07-12 MED ORDER — MUPIROCIN 2 % EX OINT
1.0000 "application " | TOPICAL_OINTMENT | Freq: Two times a day (BID) | CUTANEOUS | Status: DC
  Administered 2020-07-12 – 2020-07-15 (×6): 1 via NASAL
  Filled 2020-07-12: qty 22

## 2020-07-12 MED ORDER — CHLORHEXIDINE GLUCONATE CLOTH 2 % EX PADS
6.0000 | MEDICATED_PAD | Freq: Every day | CUTANEOUS | Status: DC
  Administered 2020-07-12: 6 via TOPICAL

## 2020-07-12 MED ORDER — QUETIAPINE FUMARATE 25 MG PO TABS
25.0000 mg | ORAL_TABLET | Freq: Every day | ORAL | Status: DC
  Administered 2020-07-12 – 2020-07-14 (×3): 25 mg via ORAL
  Filled 2020-07-12 (×3): qty 1

## 2020-07-12 MED ORDER — CEFAZOLIN SODIUM-DEXTROSE 2-4 GM/100ML-% IV SOLN
2.0000 g | INTRAVENOUS | Status: DC
  Filled 2020-07-12: qty 100

## 2020-07-12 MED ORDER — HALOPERIDOL LACTATE 5 MG/ML IJ SOLN: 2.0000 mg | Freq: Four times a day (QID) | INTRAMUSCULAR | Status: DC | PRN

## 2020-07-12 NOTE — Progress Notes (Signed)
[facility] Progress Note  Patient: Manuel Turner    XBJ:[id number]  DOA: 07/10/2020     Date of Service: the patient was seen and examined on 07/12/2020  Brief hospital course: Past medical history of chronic A. fib on Coumadin, HTN, HLD, type II DM.  Presents with nonhealing wound of the right great toe. Orthopedic was consulted.  Patient initially scheduled for a surgery on 07/13/2020 but currently adamantly refusing to go through it given his dementia. Currently plan is monitor patient's preference and continue IV antibiotics.  Assessment and Plan: Osteomyelitis of the right great toe with diabetic foot ulcer: Patient initially noted wound of his right great toe 2 weeks ago. X-ray imaging from 8/19 showed signs of osteomyelitis with concern for gas formation.  ESR and CRP currently within normal limits. Patient was initially started on Vanco and Zosyn due to the concern for the formation seen on previous x-rays. -Continue empiric antibiotics vancomycin and Zosyn -Appreciate orthopedic consultative services we will follow-up for further recommendation  Hypertensive urgency: Acute.  Blood pressures initially elevated, now improving. Home blood pressure medications include Coreg 12.5 mg twice daily and losartan 100 mg daily, but the patient has not been taking these in quite some time. -Counseled the patient on need to take blood pressure medications due to multiple health implications -Restarted home blood pressure medications of Coreg and losartan, added hydralazine as needed.  Chronic atrial fibrillation on anticoagulation:  Patient appears to be rate control though not on any of his home blood pressure medications.  He reportedly has been taking his Coumadin.  However, last INR was noted to be subtherapeutic on 11/8 at 1.5.  Last EF noted to be within normal limits back in 2011. -Hold Coumadin on heparin per pharmacy  Lower extremity swelling:  Patient with at  least 2+ bilateral lower extremity swelling.  Chest x-ray otherwise clear and grossly fluid overloaded. -Strict intake and output -Elevate lower extremity  Hypokalemia/hyponatremia: Acute. Potassium 3.4 and potassium 134on admission. Resolved. -Continue to monitor  Diabetes mellitus type 2:  At home patient is supposed to be on Metformin 1000 mg twice daily, but he has not taken this in months.  Hemoglobin A1c 7.0. -Hold Metformin -CBGs with sensitive SSI  Dementia. Somewhat agitated. As needed Haldol.  Diet: Cardiac and carb modified diet.  N.p.o. after midnight in case patient prefers to go for surgery. DVT Prophylaxis: Therapeutic heparin  Advance goals of care discussion: Full code  Family Communication: family was present at bedside, at the time of interview.  The pt provided permission to discuss medical plan with the family. Opportunity was given to ask question and all questions were answered satisfactorily.   Disposition:  Status is: Inpatient  Remains inpatient appropriate because:Inpatient level of care appropriate due to severity of illness   Dispo: The patient is from: Home              Anticipated d/c is to: Home              Anticipated d/c date is: 2 days              Patient currently is not medically stable to d/c.  Subjective: No nausea no vomiting.  No fever no chills.  Somewhat agitated.  Does not want to go for the surgery for amputation.  Physical Exam:  General: Appear in mild distress, no Rash; Oral Mucosa Clear, moist. no Abnormal Neck Mass Or lumps, Conjunctiva normal  Cardiovascular: S1 and S2 Present, no  Murmur, Respiratory: good respiratory effort, Bilateral Air entry present and CTA, no Crackles, no wheezes Abdomen: Bowel Sound present, Soft and no tenderness Extremities: right Pedal edema Neurology: alert and oriented to place and person affect anxious. no new focal deficit Gait not checked due to patient safety  concerns  Vitals:   07/12/20 0455 07/12/20 0822 07/12/20 1300 07/12/20 1943  BP: (!) 166/94 (!) 168/92 (!) 155/98 (!) 156/86  Pulse: (!) 56 72 69 60  Resp: '18 17 18 16  ' Temp: 98.8 F (37.1 C) 98 F (36.7 C) 98.7 F (37.1 C) (!) 97.2 F (36.2 C)  TempSrc: Oral Oral Oral Oral  SpO2: 99% 98% 97% 98%  Weight:      Height:        Intake/Output Summary (Last 24 hours) at 07/12/2020 2002 Last data filed at 07/12/2020 1500 Gross per 24 hour  Intake 593.56 ml  Output 1300 ml  Net -706.44 ml   Filed Weights   07/10/20 1108  Weight: 74.8 kg    Data Reviewed: I have personally reviewed and interpreted daily labs, tele strips, imagings as discussed above. I reviewed all nursing notes, pharmacy notes, vitals, pertinent old records I have discussed plan of care as described above with RN and patient/family.  CBC: Recent Labs  Lab 07/08/20 1737 07/10/20 1139 07/11/20 0441 07/12/20 0445  WBC 6.6 6.6 6.6 6.8  NEUTROABS 4.5 5.0  --   --   HGB 14.5 14.3 13.9 14.6  HCT 43.0 42.5 41.0 43.8  MCV 88.5 89.3 88.0 90.1  PLT 226 237 204 728   Basic Metabolic Panel: Recent Labs  Lab 07/08/20 1737 07/10/20 1139 07/11/20 0441  NA 134* 134* 135  K 3.2* 3.4* 3.5  CL 99 99 99  CO2 '26 25 22  ' GLUCOSE 136* 154* 140*  BUN '14 18 15  ' CREATININE 0.86 0.95 0.84  CALCIUM 9.0 9.2 9.1  MG  --   --  1.7    Studies: No results found.  Scheduled Meds: . amLODipine  5 mg Oral Daily  . atorvastatin  20 mg Oral Daily  . carvedilol  12.5 mg Oral BID  . Chlorhexidine Gluconate Cloth  6 each Topical Q0600  . insulin aspart  0-6 Units Subcutaneous TID WC  . losartan  100 mg Oral Daily  . mupirocin ointment  1 application Nasal BID  . QUEtiapine  25 mg Oral QHS  . sodium chloride flush  3 mL Intravenous Q12H   Continuous Infusions: . heparin 1,600 Units/hr (07/11/20 2253)  . piperacillin-tazobactam (ZOSYN)  IV 3.375 g (07/12/20 1552)  . vancomycin 1,250 mg (07/12/20 1348)   PRN Meds:  acetaminophen **OR** acetaminophen, albuterol, diazepam, haloperidol lactate, hydrALAZINE, metoprolol tartrate, ondansetron **OR** ondansetron (ZOFRAN) IV  Time spent: 35 minutes  Author: Berle Mull, MD [facility] 07/12/2020 8:02 PM  To reach On-call, see care teams to locate the attending and reach out via [email or web address]. Between 7PM-7AM, please contact night-coverage If you still have difficulty reaching the attending provider, please page the [facility] (Director on Call) for [facility] on amion for assistance.

## 2020-07-12 NOTE — Progress Notes (Signed)
Patient ID: Manuel Turner, male   DOB: 1946/01/15, 74 y.o.   MRN: [id number] Patient is seen in follow-up for osteomyelitis ulceration right great toe.  The family is in agreement, including the power of attorney to proceed with a right great toe amputation.  Patient does have dementia and is unsure if he wants to proceed with surgery.  Will continue with plans for surgical intervention.  Discussed with family at bedside that if patient refuses to proceed with surgery we could discharge him to home and I could follow-up in the office and plan for surgery at a later date.

## 2020-07-12 NOTE — Progress Notes (Signed)
ANTICOAGULATION CONSULT NOTE  Pharmacy Consult for Heparin Indication: atrial fibrillation  No Known Allergies  Patient Measurements: Height: 6\' 2"  (188 cm) Weight: 74.8 kg (165 lb) IBW/kg (Calculated) : 82.2 Heparin Dosing Weight: 74.8 kg  Vital Signs: Temp: 98 F (36.7 C) (11/23 0822) Temp Source: Oral (11/23 0822) BP: 168/92 (11/23 0822) Pulse Rate: 72 (11/23 0822)  Labs: Recent Labs    07/10/20 1139 07/10/20 1139 07/10/20 1637 07/11/20 0441 07/11/20 0441 07/11/20 1235 07/11/20 2051 07/12/20 0445  HGB 14.3   < >  --  13.9  --   --   --  14.6  HCT 42.5  --   --  41.0  --   --   --  43.8  PLT 237  --   --  204  --   --   --  215  LABPROT  --   --  14.1  --   --   --   --  14.3  INR  --   --  1.1  --   --   --   --  1.2  HEPARINUNFRC  --   --   --  0.15*   < > 0.12* 0.22* 0.35  CREATININE 0.95  --   --  0.84  --   --   --   --    < > = values in this interval not displayed.    Estimated Creatinine Clearance: 84.1 mL/min (by C-G formula based on SCr of 0.84 mg/dL).   Medical History: Past Medical History:  Diagnosis Date  . Atrial fibrillation (HCC)   . Diabetes mellitus without complication (HCC)   . Hyperlipemia   . Hypertension    Assessment: 74 yr old male who presented to the ED with osteomyelitis of his foot with potential gas. Patient takes warfarin at home for afib. Patient states he took his warfarin PTA; however INR on admit 1.1. Pharmacy has been asked to dose heparin while bridging/holding warfarin awaiting ortho procedure. Amputation planned for 11/24.  Warfarin PTA:  6 mg on Mon/Thurs, 4 mg on all other days   Heparin level therapeutic this morning at 0.35. CBC wnl. No active bleed issues reported.  Goal of Therapy:  Heparin level 0.3-0.7 units/ml Monitor platelets by anticoagulation protocol: Yes   Plan:  Continue heparin at 1600 units/hr Monitor daily heparin level and CBC, s/sx bleeding F/U restarting warfarin after procedure  11/24   12/24, PharmD, BCPS Please check AMION for all [facility] contact numbers Clinical Pharmacist 07/12/2020 9:45 AM

## 2020-07-12 NOTE — H&P (View-Only) (Signed)
Patient ID: Manuel Turner, male   DOB: 02/21/1946, 74 y.o.   MRN: [id number] Patient is seen in follow-up for osteomyelitis ulceration right great toe.  The family is in agreement, including the power of attorney to proceed with a right great toe amputation.  Patient does have dementia and is unsure if he wants to proceed with surgery.  Will continue with plans for surgical intervention.  Discussed with family at bedside that if patient refuses to proceed with surgery we could discharge him to home and I could follow-up in the office and plan for surgery at a later date. 

## 2020-07-13 ENCOUNTER — Inpatient Hospital Stay (HOSPITAL_COMMUNITY): Payer: Medicare HMO | Admitting: Anesthesiology

## 2020-07-13 ENCOUNTER — Encounter (HOSPITAL_COMMUNITY): Payer: Self-pay | Admitting: Internal Medicine

## 2020-07-13 ENCOUNTER — Encounter (HOSPITAL_COMMUNITY)
Admission: EM | Disposition: A | Discharge status: Home / Self Care | Payer: Self-pay | Source: Home / Self Care | Attending: Internal Medicine

## 2020-07-13 DIAGNOSIS — M86071 Acute hematogenous osteomyelitis, right ankle and foot: Secondary | ICD-10-CM

## 2020-07-13 DIAGNOSIS — I160 Hypertensive urgency: Secondary | ICD-10-CM | POA: Diagnosis not present

## 2020-07-13 DIAGNOSIS — Z7901 Long term (current) use of anticoagulants: Secondary | ICD-10-CM | POA: Diagnosis not present

## 2020-07-13 DIAGNOSIS — M869 Osteomyelitis, unspecified: Secondary | ICD-10-CM | POA: Diagnosis not present

## 2020-07-13 DIAGNOSIS — I482 Chronic atrial fibrillation, unspecified: Secondary | ICD-10-CM | POA: Diagnosis not present

## 2020-07-13 HISTORY — PX: AMPUTATION: SHX166

## 2020-07-13 LAB — CBC
HCT: 44.8 % (ref 39.0–52.0)
Hemoglobin: 15 g/dL (ref 13.0–17.0)
MCH: 30.2 pg (ref 26.0–34.0)
MCHC: 33.5 g/dL (ref 30.0–36.0)
MCV: 90.3 fL (ref 80.0–100.0)
Platelets: 214 10*3/uL (ref 150–400)
RBC: 4.96 MIL/uL (ref 4.22–5.81)
RDW: 12.4 % (ref 11.5–15.5)
WBC: 8.2 10*3/uL (ref 4.0–10.5)
nRBC: 0 % (ref 0.0–0.2)

## 2020-07-13 LAB — MAGNESIUM: Magnesium: 1.7 mg/dL (ref 1.7–2.4)

## 2020-07-13 LAB — PROTIME-INR
INR: 1.2 (ref 0.8–1.2)
Prothrombin Time: 14.2 seconds (ref 11.4–15.2)

## 2020-07-13 LAB — COMPREHENSIVE METABOLIC PANEL
ALT: 31 U/L (ref 0–44)
AST: 31 U/L (ref 15–41)
Albumin: 3.3 g/dL — ABNORMAL LOW (ref 3.5–5.0)
Alkaline Phosphatase: 75 U/L (ref 38–126)
Anion gap: 10 (ref 5–15)
BUN: 11 mg/dL (ref 8–23)
CO2: 25 mmol/L (ref 22–32)
Calcium: 9.1 mg/dL (ref 8.9–10.3)
Chloride: 100 mmol/L (ref 98–111)
Creatinine, Ser: 1.05 mg/dL (ref 0.61–1.24)
GFR, Estimated: >60 mL/min (ref >60)
Glucose, Bld: 155 mg/dL — ABNORMAL HIGH (ref 70–99)
Potassium: 3.8 mmol/L (ref 3.5–5.1)
Sodium: 135 mmol/L (ref 135–145)
Total Bilirubin: 0.5 mg/dL (ref 0.3–1.2)
Total Protein: 6.2 g/dL — ABNORMAL LOW (ref 6.5–8.1)

## 2020-07-13 LAB — GLUCOSE, CAPILLARY
Glucose-Capillary: 137 mg/dL — ABNORMAL HIGH (ref 70–99)
Glucose-Capillary: 139 mg/dL — ABNORMAL HIGH (ref 70–99)
Glucose-Capillary: 125 mg/dL — ABNORMAL HIGH (ref 70–99)
Glucose-Capillary: 196 mg/dL — ABNORMAL HIGH (ref 70–99)

## 2020-07-13 LAB — HEPARIN LEVEL (UNFRACTIONATED): Heparin Unfractionated: 0.43 IU/mL (ref 0.30–0.70)

## 2020-07-13 SURGERY — AMPUTATION DIGIT
Anesthesia: Monitor Anesthesia Care | Laterality: Right

## 2020-07-13 MED ORDER — ONDANSETRON HCL 4 MG/2ML IJ SOLN: 4.0000 mg | Freq: Four times a day (QID) | INTRAMUSCULAR | Status: DC | PRN

## 2020-07-13 MED ORDER — BUPIVACAINE-EPINEPHRINE (PF) 0.5% -1:200000 IJ SOLN
INTRAMUSCULAR | Status: DC | PRN
  Administered 2020-07-13: 10 mL via PERINEURAL
  Administered 2020-07-13: 25 mL via PERINEURAL

## 2020-07-13 MED ORDER — MIDAZOLAM HCL 2 MG/2ML IJ SOLN: 1.0000 mg | Freq: Once | INTRAMUSCULAR | Status: AC

## 2020-07-13 MED ORDER — MORPHINE SULFATE (PF) 4 MG/ML IV SOLN: 4.0000 mg | INTRAVENOUS | Status: DC | PRN

## 2020-07-13 MED ORDER — PROPOFOL 1000 MG/100ML IV EMUL
INTRAVENOUS | Status: AC
  Filled 2020-07-13: qty 100

## 2020-07-13 MED ORDER — METHOCARBAMOL 1000 MG/10ML IJ SOLN
500.0000 mg | Freq: Four times a day (QID) | INTRAVENOUS | Status: DC | PRN
  Filled 2020-07-13: qty 5

## 2020-07-13 MED ORDER — MAGNESIUM CITRATE PO SOLN: 1.0000 | Freq: Once | ORAL | Status: DC | PRN

## 2020-07-13 MED ORDER — CHLORHEXIDINE GLUCONATE 0.12 % MT SOLN
OROMUCOSAL | Status: AC
  Administered 2020-07-13: 15 mL via OROMUCOSAL
  Filled 2020-07-13: qty 15

## 2020-07-13 MED ORDER — CHLORHEXIDINE GLUCONATE 0.12 % MT SOLN: 15.0000 mL | Freq: Once | OROMUCOSAL | Status: AC

## 2020-07-13 MED ORDER — LACTATED RINGERS IV SOLN: INTRAVENOUS | Status: DC

## 2020-07-13 MED ORDER — METOCLOPRAMIDE HCL 5 MG/ML IJ SOLN: 5.0000 mg | Freq: Three times a day (TID) | INTRAMUSCULAR | Status: DC | PRN

## 2020-07-13 MED ORDER — METHOCARBAMOL 500 MG PO TABS
500.0000 mg | ORAL_TABLET | Freq: Four times a day (QID) | ORAL | Status: DC | PRN
  Administered 2020-07-14: 500 mg via ORAL
  Filled 2020-07-13: qty 1

## 2020-07-13 MED ORDER — FENTANYL CITRATE (PF) 100 MCG/2ML IJ SOLN: 25.0000 ug | INTRAMUSCULAR | Status: DC | PRN

## 2020-07-13 MED ORDER — CEFAZOLIN SODIUM-DEXTROSE 1-4 GM/50ML-% IV SOLN: 1.0000 g | Freq: Four times a day (QID) | INTRAVENOUS | Status: DC

## 2020-07-13 MED ORDER — MIDAZOLAM HCL 2 MG/2ML IJ SOLN
INTRAMUSCULAR | Status: AC
  Administered 2020-07-13: 1 mg via INTRAVENOUS
  Filled 2020-07-13: qty 2

## 2020-07-13 MED ORDER — ACETAMINOPHEN 500 MG PO TABS
ORAL_TABLET | ORAL | Status: AC
  Administered 2020-07-13: 1000 mg via ORAL
  Filled 2020-07-13: qty 2

## 2020-07-13 MED ORDER — HYDROCODONE-ACETAMINOPHEN 7.5-325 MG PO TABS: 1.0000 | ORAL_TABLET | Freq: Four times a day (QID) | ORAL | Status: DC | PRN

## 2020-07-13 MED ORDER — LOSARTAN POTASSIUM 50 MG PO TABS
100.0000 mg | ORAL_TABLET | Freq: Every day | ORAL | Status: DC
  Administered 2020-07-14 – 2020-07-15 (×2): 100 mg via ORAL
  Filled 2020-07-13 (×2): qty 2

## 2020-07-13 MED ORDER — FENTANYL CITRATE (PF) 100 MCG/2ML IJ SOLN
INTRAMUSCULAR | Status: AC
  Administered 2020-07-13: 50 ug via INTRAVENOUS
  Filled 2020-07-13: qty 2

## 2020-07-13 MED ORDER — CELECOXIB 200 MG PO CAPS: 200.0000 mg | ORAL_CAPSULE | Freq: Once | ORAL | Status: AC

## 2020-07-13 MED ORDER — CELECOXIB 200 MG PO CAPS
ORAL_CAPSULE | ORAL | Status: AC
  Administered 2020-07-13: 200 mg via ORAL
  Filled 2020-07-13: qty 1

## 2020-07-13 MED ORDER — PROPOFOL 10 MG/ML IV BOLUS
INTRAVENOUS | Status: AC
  Filled 2020-07-13: qty 20

## 2020-07-13 MED ORDER — POLYETHYLENE GLYCOL 3350 17 G PO PACK
17.0000 g | PACK | Freq: Every day | ORAL | Status: DC
  Administered 2020-07-14: 17 g via ORAL
  Filled 2020-07-13 (×2): qty 1

## 2020-07-13 MED ORDER — LORAZEPAM 2 MG/ML IJ SOLN
1.0000 mg | Freq: Once | INTRAMUSCULAR | Status: AC
  Administered 2020-07-13: 1 mg via INTRAVENOUS
  Filled 2020-07-13: qty 1

## 2020-07-13 MED ORDER — MORPHINE SULFATE (PF) 2 MG/ML IV SOLN: 0.5000 mg | INTRAVENOUS | Status: DC | PRN

## 2020-07-13 MED ORDER — VANCOMYCIN HCL IN DEXTROSE 1-5 GM/200ML-% IV SOLN
1000.0000 mg | Freq: Two times a day (BID) | INTRAVENOUS | Status: AC
  Administered 2020-07-14: 1000 mg via INTRAVENOUS
  Filled 2020-07-13: qty 200

## 2020-07-13 MED ORDER — FENTANYL CITRATE (PF) 100 MCG/2ML IJ SOLN: 50.0000 ug | Freq: Once | INTRAMUSCULAR | Status: AC

## 2020-07-13 MED ORDER — PROMETHAZINE HCL 25 MG/ML IJ SOLN: 6.2500 mg | INTRAMUSCULAR | Status: DC | PRN

## 2020-07-13 MED ORDER — DOCUSATE SODIUM 100 MG PO CAPS
100.0000 mg | ORAL_CAPSULE | Freq: Two times a day (BID) | ORAL | Status: DC
  Administered 2020-07-13 – 2020-07-14 (×4): 100 mg via ORAL
  Filled 2020-07-13 (×5): qty 1

## 2020-07-13 MED ORDER — ORAL CARE MOUTH RINSE: 15.0000 mL | Freq: Once | OROMUCOSAL | Status: AC

## 2020-07-13 MED ORDER — HYDROCODONE-ACETAMINOPHEN 5-325 MG PO TABS
1.0000 | ORAL_TABLET | ORAL | Status: DC | PRN
  Administered 2020-07-14 (×2): 2 via ORAL
  Filled 2020-07-13 (×2): qty 2

## 2020-07-13 MED ORDER — PROPOFOL 500 MG/50ML IV EMUL
INTRAVENOUS | Status: DC | PRN
  Administered 2020-07-13: 50 ug/kg/min via INTRAVENOUS

## 2020-07-13 MED ORDER — HYDROCODONE-ACETAMINOPHEN 7.5-325 MG PO TABS
1.0000 | ORAL_TABLET | ORAL | Status: DC | PRN
  Administered 2020-07-14: 1 via ORAL
  Filled 2020-07-13: qty 1

## 2020-07-13 MED ORDER — ACETAMINOPHEN 325 MG PO TABS: 325.0000 mg | ORAL_TABLET | Freq: Four times a day (QID) | ORAL | Status: DC | PRN

## 2020-07-13 MED ORDER — ONDANSETRON HCL 4 MG PO TABS: 4.0000 mg | ORAL_TABLET | Freq: Four times a day (QID) | ORAL | Status: DC | PRN

## 2020-07-13 MED ORDER — BISACODYL 10 MG RE SUPP: 10.0000 mg | Freq: Every day | RECTAL | Status: DC | PRN

## 2020-07-13 MED ORDER — POLYETHYLENE GLYCOL 3350 17 G PO PACK: 17.0000 g | PACK | Freq: Every day | ORAL | Status: DC | PRN

## 2020-07-13 MED ORDER — SODIUM CHLORIDE 0.9 % IV SOLN: INTRAVENOUS | Status: DC

## 2020-07-13 MED ORDER — METOCLOPRAMIDE HCL 5 MG PO TABS: 5.0000 mg | ORAL_TABLET | Freq: Three times a day (TID) | ORAL | Status: DC | PRN

## 2020-07-13 MED ORDER — ACETAMINOPHEN 500 MG PO TABS: 1000.0000 mg | ORAL_TABLET | Freq: Once | ORAL | Status: AC

## 2020-07-13 MED ORDER — SODIUM CHLORIDE 0.9 % IR SOLN
Status: DC | PRN
  Administered 2020-07-13: 1000 mL

## 2020-07-13 SURGICAL SUPPLY — 31 items
BLADE SURG 21 STRL SS (BLADE) ×2 IMPLANT
BNDG CMPR 9X4 STRL LF SNTH (GAUZE/BANDAGES/DRESSINGS)
BNDG COHESIVE 4X5 TAN STRL (GAUZE/BANDAGES/DRESSINGS) ×2 IMPLANT
BNDG ESMARK 4X9 LF (GAUZE/BANDAGES/DRESSINGS) IMPLANT
BNDG GAUZE ELAST 4 BULKY (GAUZE/BANDAGES/DRESSINGS) ×2 IMPLANT
COVER SURGICAL LIGHT HANDLE (MISCELLANEOUS) ×4 IMPLANT
COVER WAND RF STERILE (DRAPES) ×2 IMPLANT
DRAPE U-SHAPE 47X51 STRL (DRAPES) ×2 IMPLANT
DRSG ADAPTIC 3X8 NADH LF (GAUZE/BANDAGES/DRESSINGS) ×1 IMPLANT
DRSG PAD ABDOMINAL 8X10 ST (GAUZE/BANDAGES/DRESSINGS) ×2 IMPLANT
DURAPREP 26ML APPLICATOR (WOUND CARE) ×2 IMPLANT
ELECT REM PT RETURN 9FT ADLT (ELECTROSURGICAL) ×2
ELECTRODE REM PT RTRN 9FT ADLT (ELECTROSURGICAL) ×1 IMPLANT
GAUZE SPONGE 4X4 12PLY STRL (GAUZE/BANDAGES/DRESSINGS) ×1 IMPLANT
GLOVE BIOGEL PI IND STRL 9 (GLOVE) ×1 IMPLANT
GLOVE BIOGEL PI INDICATOR 9 (GLOVE) ×1
GLOVE SURG ORTHO 9.0 STRL STRW (GLOVE) ×2 IMPLANT
GLOVE SURG UNDER POLY LF SZ7 (GLOVE) ×1 IMPLANT
GOWN STRL REUS W/ TWL XL LVL3 (GOWN DISPOSABLE) ×2 IMPLANT
GOWN STRL REUS W/TWL XL LVL3 (GOWN DISPOSABLE) ×4
KIT BASIN OR (CUSTOM PROCEDURE TRAY) ×2 IMPLANT
KIT TURNOVER KIT B (KITS) ×2 IMPLANT
MANIFOLD NEPTUNE II (INSTRUMENTS) ×2 IMPLANT
NEEDLE 22X1 1/2 (OR ONLY) (NEEDLE) IMPLANT
NS IRRIG 1000ML POUR BTL (IV SOLUTION) ×2 IMPLANT
PACK ORTHO EXTREMITY (CUSTOM PROCEDURE TRAY) ×2 IMPLANT
PAD ABD 8X10 STRL (GAUZE/BANDAGES/DRESSINGS) ×1 IMPLANT
PAD ARMBOARD 7.5X6 YLW CONV (MISCELLANEOUS) ×4 IMPLANT
SUT ETHILON 2 0 PSLX (SUTURE) ×2 IMPLANT
SYR CONTROL 10ML LL (SYRINGE) IMPLANT
TOWEL GREEN STERILE (TOWEL DISPOSABLE) ×2 IMPLANT

## 2020-07-13 NOTE — Interval H&P Note (Signed)
History and Physical Interval Note:  07/13/2020 6:50 AM  Manuel Turner  has presented today for surgery, with the diagnosis of Osteomyelitis Right Great Toe.  The various methods of treatment have been discussed with the patient and family. After consideration of risks, benefits and other options for treatment, the patient has consented to  Procedure(s): RIGHT GREAT TOE AMPUTATION (Right) as a surgical intervention.  The patient's history has been reviewed, patient examined, no change in status, stable for surgery.  I have reviewed the patient's chart and labs.  Questions were answered to the patient's satisfaction.     Nadara Mustard

## 2020-07-13 NOTE — Anesthesia Procedure Notes (Signed)
Anesthesia Regional Block: Popliteal block   Pre-Anesthetic Checklist: ,, timeout performed, Correct Patient, Correct Site, Correct Laterality, Correct Procedure, Correct Position, site marked, Risks and benefits discussed,  Surgical consent,  Pre-op evaluation,  At surgeon's request and post-op pain management  Laterality: Right  Prep: chloraprep       Needles:  Injection technique: Single-shot  Needle Type: Echogenic Needle     Needle Length: 10cm  Needle Gauge: 21     Additional Needles:   Narrative:  Start time: 07/13/2020 10:26 AM End time: 07/13/2020 10:29 AM Injection made incrementally with aspirations every 5 mL.  Performed by: Personally  Anesthesiologist: Beryle Lathe, MD  Additional Notes: No pain on injection. No increased resistance to injection. Injection made in 5cc increments. Good needle visualization. Patient tolerated the procedure well.

## 2020-07-13 NOTE — Evaluation (Signed)
Physical Therapy Evaluation Patient Details Name: Manuel Turner MRN: [id number] DOB: [birth year] Today's Date: 07/13/2020   History of Present Illness  74 y.o. male with medical history significant of chronic atrial fibrillation on Coumadin, hypertension, hyperlipidemia, and diabetes mellitus type 2 presents for a nonhealing wound of his right great toe, found to have osteomyelitis. Pt underwent R great toe amputation on 07/13/2020.  Clinical Impression  Pt presents to PT with deficits in functional mobility, gait, balance, cognition. Pt with history of dementia, requiring frequent cues during session to implement WB precautions. Pt requires significant physical assistance to transfer to standing, needing PT weight shift to offload RLE. Per discussion with pt's spouse, the pt has been declining some recently prior to this admission and she is unable to provide much physical assistance. PT recommends SNF placement at this time as the pt remains at a high risk of falling or of bearing significant weight through RLE at this time.     Follow Up Recommendations SNF;Supervision/Assistance - 24 hour    Equipment Recommendations  Rolling walker with 5" wheels;Wheelchair (measurements PT);Wheelchair cushion (measurements PT)    Recommendations for Other Services       Precautions / Restrictions Precautions Precautions: Fall Required Braces or Orthoses: Other Brace (post-op shoe) Restrictions Weight Bearing Restrictions: Yes Other Position/Activity Restrictions: per secure chat with Dr. Lajoyce Corners, ideally the pt is to be NWB, due to cognitive deficits TDWB is ok      Mobility  Bed Mobility Overal bed mobility: Needs Assistance Bed Mobility: Supine to Sit;Sit to Supine     Supine to sit: Supervision Sit to supine: Min assist        Transfers Overall transfer level: Needs assistance Equipment used: 4-wheeled walker;1 person hand held assist Transfers: Sit to/from Stand Sit to Stand: Max  assist         General transfer comment: pt unable to safely stand with PT assist and rollator at this time due to breaking WB precautions. PT provides L knee block and BUE support as well as physical assist to lean pt toward L side to offload RLE during 2nd stand attempt at edge of bed  Ambulation/Gait                Stairs            Wheelchair Mobility    Modified Rankin (Stroke Patients Only)       Balance Overall balance assessment: Needs assistance Sitting-balance support: No upper extremity supported;Feet supported Sitting balance-Leahy Scale: Good     Standing balance support: Bilateral upper extremity supported Standing balance-Leahy Scale: Zero Standing balance comment: maxA to maintain WB precautions with BUE support of PT                             Pertinent Vitals/Pain Pain Assessment: Faces Faces Pain Scale: Hurts a little bit Pain Location: R foot Pain Descriptors / Indicators: Grimacing Pain Intervention(s): Monitored during session    Home Living Family/patient expects to be discharged to:: Private residence Living Arrangements: Spouse/significant other Available Help at Discharge: Family;Available 24 hours/day Type of Home: House Home Access: Stairs to enter Entrance Stairs-Rails: None Entrance Stairs-Number of Steps: 2 Home Layout: Multi-level;Full bath on main level Home Equipment: Walker - 4 wheels;Shower seat      Prior Function Level of Independence: Needs assistance   Gait / Transfers Assistance Needed: pt was mobilizing independently prior to admission  ADL's / Homemaking Assistance Needed:  pt required assistance with money management and med management        Hand Dominance        Extremity/Trunk Assessment   Upper Extremity Assessment Upper Extremity Assessment: Overall WFL for tasks assessed    Lower Extremity Assessment Lower Extremity Assessment: Overall WFL for tasks assessed;RLE  deficits/detail RLE Sensation: history of peripheral neuropathy    Cervical / Trunk Assessment Cervical / Trunk Assessment: Normal  Communication   Communication: No difficulties  Cognition Arousal/Alertness: Awake/alert Behavior During Therapy: WFL for tasks assessed/performed Overall Cognitive Status: History of cognitive impairments - at baseline Area of Impairment: Orientation;Memory;Following commands;Safety/judgement;Problem solving;Awareness                 Orientation Level: Disoriented to;Time   Memory: Decreased recall of precautions;Decreased short-term memory Following Commands: Follows one step commands consistently Safety/Judgement: Decreased awareness of safety;Decreased awareness of deficits Awareness: Emergent          General Comments General comments (skin integrity, edema, etc.): VSS on RA    Exercises     Assessment/Plan    PT Assessment Patient needs continued PT services  PT Problem List Decreased balance;Decreased mobility;Decreased cognition;Decreased knowledge of use of DME;Decreased safety awareness;Decreased knowledge of precautions;Pain;Impaired sensation       PT Treatment Interventions DME instruction;Gait training;Stair training;Functional mobility training;Therapeutic activities;Therapeutic exercise;Balance training;Neuromuscular re-education;Cognitive remediation;Patient/family education    PT Goals (Current goals can be found in the Care Plan section)  Acute Rehab PT Goals Patient Stated Goal: To maintain safety when mobilizing to allow amputation to heal PT Goal Formulation: With patient/family Time For Goal Achievement: 07/27/20 Potential to Achieve Goals: Fair    Frequency Min 2X/week   Barriers to discharge        Co-evaluation               AM-PAC PT "6 Clicks" Mobility  Outcome Measure Help needed turning from your back to your side while in a flat bed without using bedrails?: None Help needed moving from  lying on your back to sitting on the side of a flat bed without using bedrails?: None Help needed moving to and from a bed to a chair (including a wheelchair)?: Total Help needed standing up from a chair using your arms (e.g., wheelchair or bedside chair)?: Total Help needed to walk in hospital room?: Total Help needed climbing 3-5 steps with a railing? : Total 6 Click Score: 12    End of Session   Activity Tolerance: Patient tolerated treatment well   Nurse Communication: Mobility status;Need for lift equipment PT Visit Diagnosis: Other abnormalities of gait and mobility (R26.89);Difficulty in walking, not elsewhere classified (R26.2)    Time: 2831-5176 PT Time Calculation (min) (ACUTE ONLY): 42 min   Charges:   PT Evaluation $PT Eval Moderate Complexity: 1 Mod PT Treatments $Therapeutic Activity: 8-22 mins        Arlyss Gandy, PT, DPT Acute Rehabilitation Pager: [phone]   Arlyss Gandy 07/13/2020, 5:44 PM

## 2020-07-13 NOTE — Progress Notes (Signed)
Pharmacy Antibiotic Note  Manuel Turner is a 74 y.o. male admitted on 07/10/2020 with Osteomyelitis of R great toe.  Pharmacy has been consulted for Zosyn and Vancomycin dosing. SCr trended up 0.84>>1.05 today.  Patient s/p great toe amputation 11/24. Per Dr. Audrie Lia note, continue antibiotics for 24hrs post-op.  Plan: Adjust vancomycin to 1g IV q12h for renal function Continue Zosyn 3.375g IV q8h (4h infusion) Antibiotic stop dates entered for 24hrs post-op per Dr. Audrie Lia recommendations   Height: 6\' 2"  (188 cm) Weight: 74.8 kg (165 lb) IBW/kg (Calculated) : 82.2  Temp (24hrs), Avg:97.8 F (36.6 C), Min:97.2 F (36.2 C), Max:98.2 F (36.8 C)  Recent Labs  Lab 07/08/20 1737 07/10/20 1139 07/11/20 0441 07/12/20 0445 07/13/20 0611  WBC 6.6 6.6 6.6 6.8 8.2  CREATININE 0.86 0.95 0.84  --  1.05  LATICACIDVEN  --  0.9  --   --   --     Estimated Creatinine Clearance: 67.3 mL/min (by C-G formula based on SCr of 1.05 mg/dL).    No Known Allergies   07/15/20, PharmD, BCPS Please check AMION for all [facility] Pharm[facility] contact numbers Clinical Pharmacist 07/13/2020 2:07 PM

## 2020-07-13 NOTE — OR Nursing (Signed)
Pt is awake,alert and oriented.Pt and/or family verbalized understanding of poc and discharge instructions. Reviewed admission and on going care with receiving RN. Pt is in NAD at this time and is ready to be transferred to floor. Will con't to monitor until pt is transferred.  

## 2020-07-13 NOTE — Anesthesia Procedure Notes (Signed)
Anesthesia Regional Block: Adductor canal block   Pre-Anesthetic Checklist: ,, timeout performed, Correct Patient, Correct Site, Correct Laterality, Correct Procedure, Correct Position, site marked, Risks and benefits discussed,  Surgical consent,  Pre-op evaluation,  At surgeon's request and post-op pain management  Laterality: Right  Prep: chloraprep       Needles:  Injection technique: Single-shot  Needle Type: Echogenic Needle     Needle Length: 10cm  Needle Gauge: 21     Additional Needles:   Narrative:  Start time: 07/13/2020 10:22 AM End time: 07/13/2020 10:25 AM Injection made incrementally with aspirations every 5 mL.  Performed by: Personally  Anesthesiologist: Beryle Lathe, MD  Additional Notes: No pain on injection. No increased resistance to injection. Injection made in 5cc increments. Good needle visualization. Patient tolerated the procedure well.

## 2020-07-13 NOTE — Progress Notes (Signed)
[facility] PROGRESS NOTE    Progress Note  Manuel Turner  IFO:[id number] DOB: [birth year] DOA: 07/10/2020 PCP: Jarrett Soho, PA-C     Brief Narrative:   Manuel Turner is an 74 y.o. male chronic atrial fibrillation on Coumadin, essential hypertension, diabetes mellitus type 2 presented with nonhealing right great toe ulcer, orthopedic was consulted and he was scheduled for surgery on 07/13/2020, but patient adamantly refusing to go through it, but subsequently agreed.  Assessment/Plan:   Osteomyelitis of toe (HCC) on the right foot/diabetic foot ulcer: Imaging from Jan 06, 2020 showed osteomyelitis. He was started empirically on Vanco and Zosyn, orthopedic surgery was consulted and is scheduled for amputation on 07/13/2020.  Hypertensive urgency: She was not taking his medications at home. He was restarted on Coreg losartan and hydralazine was added as needed, his blood pressure has improved still mildly elevated. Hold ARB check a basic metabolic panel tomorrow morning. Weaning has remained stable can resume ARB tomorrow.  Chronic fibrillation on anticoagulation: INR was low on admission 1.5 Coumadin held for orthopedic procedure. We will resume when surgery agrees.  Lower extremity swelling: 2+ bilaterally chest x-ray shows no gross fluid overload. Continue strict I's and O's and daily weights. No lower extremity swelling negative JVD.  Hypokalemia/hyponatremia: Repleted now resolved.  Diabetes mellitus type 2: Holding Metformin on admission. Hemoglobin A1c of 7.0. Continue sliding scale insulin.  Possible undiagnosed dementia/acute confusional state/delirium: He is at high risk of developing acute confusional state due to sedatives or medication. We will have a low threshold to use Haldol IV as needed continue Seroquel at night.   DVT prophylaxis: heparin now on hold for Surgery Family Communication:daughters Status is: Inpatient  Remains inpatient  appropriate because:Hemodynamically unstable   Dispo: The patient is from: Home              Anticipated d/c is to: Home              Anticipated d/c date is: 2 days              Patient currently is not medically stable to d/c.     Code Status:     Code Status Orders  (From admission, onward)         Start     Ordered   07/10/20 1407  Full code  Continuous        07/10/20 1408        Code Status History    This patient has a current code status but no historical code status.   Advance Care Planning Activity    Advance Directive Documentation     Most Recent Value  Type of Advance Directive Living will  Pre-existing out of facility DNR order (yellow form or pink MOST form) --  "MOST" Form in Place? --        IV Access:    Peripheral IV   Procedures and diagnostic studies:   VAS [place] ABI WITH/WO TBI  Result Date: 07/11/2020 LOWER EXTREMITY DOPPLER STUDY Indications: Ulceration. High Risk Factors: Hypertension, hyperlipidemia, Diabetes.  Limitations: Today's exam was limited due to bandages. Comparison Study: no prior Performing Technologist: Blanch Media RVS  Examination Guidelines: A complete evaluation includes at minimum, Doppler waveform signals and systolic blood pressure reading at the level of bilateral brachial, anterior tibial, and posterior tibial arteries, when vessel segments are accessible. Bilateral testing is considered an integral part of a complete examination. Photoelectric Plethysmograph (PPG) waveforms and toe systolic pressure readings are included  as required and additional duplex testing as needed. Limited examinations for reoccurring indications may be performed as noted.  ABI Findings: +--------+------------------+-----+---------+--------+  Right    Rt Pressure (mmHg) Index Waveform  Comment   +--------+------------------+-----+---------+--------+  Brachial 170                      triphasic            +--------+------------------+-----+---------+--------+  PTA      250                1.30  triphasic           +--------+------------------+-----+---------+--------+  DP       246                1.28  triphasic           +--------+------------------+-----+---------+--------+ +--------+------------------+-----+---------+-------+  Left     Lt Pressure (mmHg) Index Waveform  Comment  +--------+------------------+-----+---------+-------+  Brachial 192                      triphasic          +--------+------------------+-----+---------+-------+  PTA      244                1.27  triphasic          +--------+------------------+-----+---------+-------+  DP       250                1.30  triphasic          +--------+------------------+-----+---------+-------+ +-------+-----------+-----------+------------+------------+  ABI/TBI Today's ABI Today's TBI Previous ABI Previous TBI  +-------+-----------+-----------+------------+------------+  Right   1.30        bandages                               +-------+-----------+-----------+------------+------------+  Left    1.30                                               +-------+-----------+-----------+------------+------------+  Summary: Right: Resting right ankle-brachial index is within normal range. No evidence of significant right lower extremity arterial disease. ABIs may be unreliable. Left: Resting left ankle-brachial index is within normal range. No evidence of significant left lower extremity arterial disease. ABIs may be unreliable.  *See table(s) above for measurements and observations.  Electronically signed by Sherald Hesshristopher Clark MD on 07/11/2020 at 4:40:34 PM.   Final      Medical Consultants:    None.  Anti-Infectives:   zosyn  Subjective:    Charlestine NightKenneth W Turner is willing to go through surgery daughters are at bedside and they agreed.  Objective:    Vitals:   07/12/20 1300 07/12/20 1943 07/13/20 0348 07/13/20 0815  BP: (!) 155/98 (!) 156/86 (!)  167/74 (!) 177/84  Pulse: 69 60 (!) 43 69  Resp: 18 16 15 17   Temp: 98.7 F (37.1 C) (!) 97.2 F (36.2 C) 98.2 F (36.8 C) 98.1 F (36.7 C)  TempSrc: Oral Oral Oral Oral  SpO2: 97% 98% 99% 98%  Weight:      Height:       SpO2: 98 %   Intake/Output Summary (Last 24 hours) at 07/13/2020 0826 Last data filed at 07/12/2020 1500 Gross per 24 hour  Intake 233.34  ml  Output 1300 ml  Net -1066.66 ml   Filed Weights   07/10/20 1108  Weight: 74.8 kg    Exam: General exam: In no acute distress. Respiratory system: Good air movement and clear to auscultation. Cardiovascular system: S1 & S2 heard, RRR. No JVD. Gastrointestinal system: Abdomen is nondistended, soft and nontender.  Extremities: No pedal edema. Skin: Ulcerated dark right great toe Psychiatry: Judgement and insight appear normal. Mood & affect appropriate.    Data Reviewed:    Labs: Basic Metabolic Panel: Recent Labs  Lab 07/08/20 1737 07/08/20 1737 07/10/20 1139 07/10/20 1139 07/11/20 0441 07/13/20 0611  NA 134*  --  134*  --  135 135  K 3.2*   < > 3.4*   < > 3.5 3.8  CL 99  --  99  --  99 100  CO2 26  --  25  --  22 25  GLUCOSE 136*  --  154*  --  140* 155*  BUN 14  --  18  --  15 11  CREATININE 0.86  --  0.95  --  0.84 1.05  CALCIUM 9.0  --  9.2  --  9.1 9.1  MG  --   --   --   --  1.7 1.7   < > = values in this interval not displayed.   GFR Estimated Creatinine Clearance: 67.3 mL/min (by C-G formula based on SCr of 1.05 mg/dL). Liver Function Tests: Recent Labs  Lab 07/10/20 1139 07/13/20 0611  AST 22 31  ALT 22 31  ALKPHOS 81 75  BILITOT 0.7 0.5  PROT 7.1 6.2*  ALBUMIN 3.8 3.3*   No results for input(s): LIPASE, AMYLASE in the last 168 hours. No results for input(s): AMMONIA in the last 168 hours. Coagulation profile Recent Labs  Lab 07/10/20 1637 07/12/20 0445 07/13/20 0611  INR 1.1 1.2 1.2   COVID-19 Labs  Recent Labs    07/10/20 1139  CRP 0.5    Lab Results    Component Value Date   SARSCOV2NAA NEGATIVE 07/10/2020    CBC: Recent Labs  Lab 07/08/20 1737 07/10/20 1139 07/11/20 0441 07/12/20 0445 07/13/20 0611  WBC 6.6 6.6 6.6 6.8 8.2  NEUTROABS 4.5 5.0  --   --   --   HGB 14.5 14.3 13.9 14.6 15.0  HCT 43.0 42.5 41.0 43.8 44.8  MCV 88.5 89.3 88.0 90.1 90.3  PLT 226 237 204 215 214   Cardiac Enzymes: No results for input(s): CKTOTAL, CKMB, CKMBINDEX, TROPONINI in the last 168 hours. BNP (last 3 results) No results for input(s): PROBNP in the last 8760 hours. CBG: Recent Labs  Lab 07/12/20 0759 07/12/20 1352 07/12/20 1637 07/12/20 2020 07/13/20 0638  GLUCAP 234* 174* 106* 205* 137*   D-Dimer: No results for input(s): DDIMER in the last 72 hours. Hgb A1c: Recent Labs    07/10/20 1139  HGBA1C 7.0*   Lipid Profile: No results for input(s): CHOL, HDL, LDLCALC, TRIG, CHOLHDL, LDLDIRECT in the last 72 hours. Thyroid function studies: No results for input(s): TSH, T4TOTAL, T3FREE, THYROIDAB in the last 72 hours.  Invalid input(s): FREET3 Anemia work up: No results for input(s): VITAMINB12, FOLATE, FERRITIN, TIBC, IRON, RETICCTPCT in the last 72 hours. Sepsis Labs: Recent Labs  Lab 07/10/20 1139 07/11/20 0441 07/12/20 0445 07/13/20 0611  WBC 6.6 6.6 6.8 8.2  LATICACIDVEN 0.9  --   --   --    Microbiology Recent Results (from the past 240 hour(s))  Blood Cultures  x 2 sites     Status: None (Preliminary result)   Collection Time: 07/10/20 11:40 AM   Specimen: BLOOD  Result Value Ref Range Status   Specimen Description BLOOD RIGHT ANTECUBITAL  Final   Special Requests   Final    BOTTLES DRAWN AEROBIC AND ANAEROBIC Blood Culture results may not be optimal due to an excessive volume of blood received in culture bottles   Culture   Final    NO GROWTH 3 DAYS Performed at [facility], [address] [place], [place] [address]    Report Status PENDING  Incomplete  Blood Cultures x 2 sites     Status: None  (Preliminary result)   Collection Time: 07/10/20 11:50 AM   Specimen: BLOOD LEFT FOREARM  Result Value Ref Range Status   Specimen Description BLOOD LEFT FOREARM  Final   Special Requests   Final    BOTTLES DRAWN AEROBIC AND ANAEROBIC Blood Culture results may not be optimal due to an excessive volume of blood received in culture bottles   Culture   Final    NO GROWTH 3 DAYS Performed at [facility], [address], [place], [place] [address]    Report Status PENDING  Incomplete  Respiratory Panel by RT PCR (Flu A&B, Covid) - Nasopharyngeal Swab     Status: None   Collection Time: 07/10/20 12:08 PM   Specimen: Nasopharyngeal Swab; Nasopharyngeal(NP) swabs in vial transport medium  Result Value Ref Range Status   SARS Coronavirus 2 by RT PCR NEGATIVE NEGATIVE Final    Comment: (NOTE) SARS-CoV-2 target nucleic acids are NOT DETECTED.  The SARS-CoV-2 RNA is generally detectable in upper respiratoy specimens during the acute phase of infection. The lowest concentration of SARS-CoV-2 viral copies this assay can detect is 131 copies/mL. A negative result does not preclude SARS-Cov-2 infection and should not be used as the sole basis for treatment or other patient management decisions. A negative result may occur with  improper specimen collection/handling, submission of specimen other than nasopharyngeal swab, presence of viral mutation(s) within the areas targeted by this assay, and inadequate number of viral copies (<131 copies/mL). A negative result must be combined with clinical observations, patient history, and epidemiological information. The expected result is Negative.  Fact Sheet for Patients:  https://www.moore.com/  Fact Sheet for Healthcare Providers:  https://www.young.biz/  This test is no t yet approved or cleared by the [place] FDA and  has been authorized for detection and/or diagnosis of SARS-CoV-2 by FDA under  an Emergency Use Authorization (EUA). This EUA will remain  in effect (meaning this test can be used) for the duration of the COVID-19 declaration under Section 564(b)(1) of the Act, 21 U.S.C. section 360bbb-3(b)(1), unless the authorization is terminated or revoked sooner.     Influenza A by PCR NEGATIVE NEGATIVE Final   Influenza B by PCR NEGATIVE NEGATIVE Final    Comment: (NOTE) The Xpert Xpress SARS-CoV-2/FLU/RSV assay is intended as an aid in  the diagnosis of influenza from Nasopharyngeal swab specimens and  should not be used as a sole basis for treatment. Nasal washings and  aspirates are unacceptable for Xpert Xpress SARS-CoV-2/FLU/RSV  testing.  Fact Sheet for Patients: https://www.moore.com/  Fact Sheet for Healthcare Providers: https://www.young.biz/  This test is not yet approved or cleared by the [place] FDA and  has been authorized for detection and/or diagnosis of SARS-CoV-2 by  FDA under an Emergency Use Authorization (EUA). This EUA will remain  in  effect (meaning this test can be used) for the duration of the  Covid-19 declaration under Section 564(b)(1) of the Act, 21  U.S.C. section 360bbb-3(b)(1), unless the authorization is  terminated or revoked. Performed at [facility], [address] [place], [place] [address]   Blood Cultures x 2 sites     Status: None (Preliminary result)   Collection Time: 07/10/20  2:38 PM   Specimen: BLOOD RIGHT HAND  Result Value Ref Range Status   Specimen Description BLOOD RIGHT HAND  Final   Special Requests   Final    BOTTLES DRAWN AEROBIC AND ANAEROBIC Blood Culture results may not be optimal due to an inadequate volume of blood received in culture bottles   Culture   Final    NO GROWTH 3 DAYS Performed at [facility], [address], [place], [place] [address]    Report Status PENDING  Incomplete  Blood Cultures x 2 sites     Status: None (Preliminary result)    Collection Time: 07/10/20  2:43 PM   Specimen: BLOOD LEFT HAND  Result Value Ref Range Status   Specimen Description BLOOD LEFT HAND  Final   Special Requests   Final    BOTTLES DRAWN AEROBIC AND ANAEROBIC Blood Culture adequate volume   Culture   Final    NO GROWTH 3 DAYS Performed at [facility], [address], [place], [place] [address]    Report Status PENDING  Incomplete  Surgical pcr screen     Status: Abnormal   Collection Time: 07/11/20  5:00 PM   Specimen: Nasal Mucosa; Nasal Swab  Result Value Ref Range Status   MRSA, PCR NEGATIVE NEGATIVE Final   Staphylococcus aureus POSITIVE (A) NEGATIVE Final    Comment: (NOTE) The Xpert SA Assay (FDA approved for NASAL specimens in patients 9 years of age and older), is one component of a comprehensive surveillance program. It is not intended to diagnose infection nor to guide or monitor treatment. Performed at [facility], [address] [place], [place] [address]      Medications:    amLODipine  5 mg Oral Daily   atorvastatin  20 mg Oral Daily   carvedilol  12.5 mg Oral BID   Chlorhexidine Gluconate Cloth  6 each Topical Q0600   insulin aspart  0-6 Units Subcutaneous TID WC   losartan  100 mg Oral Daily   mupirocin ointment  1 application Nasal BID   QUEtiapine  25 mg Oral QHS   sodium chloride flush  3 mL Intravenous Q12H   Continuous Infusions:   ceFAZolin (ANCEF) IV     heparin 1,600 Units/hr (07/11/20 2253)   piperacillin-tazobactam (ZOSYN)  IV 3.375 g (07/13/20 7001)   vancomycin 1,250 mg (07/13/20 0130)      LOS: 3 days   Marinda Elk  [facility]  07/13/2020, 8:26 AM

## 2020-07-13 NOTE — Op Note (Signed)
07/13/2020  11:44 AM  PATIENT:  Manuel Turner    PRE-OPERATIVE DIAGNOSIS:  Osteomyelitis Right Great Toe  POST-OPERATIVE DIAGNOSIS:  Same  PROCEDURE:  RIGHT GREAT TOE AMPUTATION  SURGEON:  Nadara Mustard, MD  PHYSICIAN ASSISTANT:None ANESTHESIA:   General  PREOPERATIVE INDICATIONS:  Manuel Turner is a  74 y.o. male with a diagnosis of Osteomyelitis Right Great Toe who failed conservative measures and elected for surgical management.    The risks benefits and alternatives were discussed with the patient preoperatively including but not limited to the risks of infection, bleeding, nerve injury, cardiopulmonary complications, the need for revision surgery, among others, and the patient was willing to proceed.  OPERATIVE IMPLANTS: None  @ENCIMAGES @  OPERATIVE FINDINGS: Minimal petechial good pulse of the ankle  OPERATIVE PROCEDURE: Patient brought the operating room and underwent a regional anesthetic.  After adequate levels anesthesia were obtained patient's right lower extremity was prepped using DuraPrep draped into a sterile field a timeout was called.  A fishmouth incision was made just distal to the MTP joint the toe was amputated through the MTP joint.  There was minimal petechial bleeding the wound was irrigated with normal saline no signs of infection at the MTP joint.  Patient also had a Wagner grade 1 ulcer on the plantar aspect of the first metatarsal head patient did not want to extend the amputation proximal to include the ulcer and this was left intact this appeared superficial.  The wound was irrigated normal saline and closed with 2-0 nylon a sterile dressing was applied patient was taken the PACU in stable condition   DISCHARGE PLANNING:  Antibiotic duration: Continue antibiotics for 24 hours  Weightbearing: Nonweightbearing on the right  Pain medication: Low-dose opioid pathway  Dressing care/ Wound VAC: Dry dressing change as needed  Ambulatory devices:  Walker  Discharge to: Anticipate discharge to home  Follow-up: In the office 1 week post operative.

## 2020-07-13 NOTE — Progress Notes (Signed)
ANTICOAGULATION CONSULT NOTE  Pharmacy Consult for Heparin Indication: atrial fibrillation  No Known Allergies  Patient Measurements: Height: 6\' 2"  (188 cm) Weight: 74.8 kg (165 lb) IBW/kg (Calculated) : 82.2 Heparin Dosing Weight: 74.8 kg  Vital Signs: Temp: 98.1 F (36.7 C) (11/24 0815) Temp Source: Oral (11/24 0815) BP: 177/84 (11/24 0815) Pulse Rate: 69 (11/24 0815)  Labs: Recent Labs    07/10/20 1139 07/10/20 1139 07/10/20 1637 07/11/20 0441 07/11/20 0441 07/11/20 1235 07/11/20 2051 07/12/20 0445 07/13/20 0611  HGB 14.3   < >  --  13.9   < >  --   --  14.6 15.0  HCT 42.5   < >  --  41.0  --   --   --  43.8 44.8  PLT 237   < >  --  204  --   --   --  215 214  LABPROT  --   --  14.1  --   --   --   --  14.3 14.2  INR  --   --  1.1  --   --   --   --  1.2 1.2  HEPARINUNFRC  --   --   --  0.15*  --    < > 0.22* 0.35 0.43  CREATININE 0.95  --   --  0.84  --   --   --   --  1.05   < > = values in this interval not displayed.    Estimated Creatinine Clearance: 67.3 mL/min (by C-G formula based on SCr of 1.05 mg/dL).   Medical History: Past Medical History:  Diagnosis Date  . Atrial fibrillation (HCC)   . Diabetes mellitus without complication (HCC)   . Hyperlipemia   . Hypertension    Assessment: 74 yr old male who presented to the ED with osteomyelitis of his foot with potential gas. Patient takes warfarin at home for afib. Patient states he took his warfarin PTA; however INR on admit 1.1. Pharmacy has been asked to dose heparin while bridging/holding warfarin awaiting ortho procedure. Amputation planned for 11/24.  Warfarin PTA:  6 mg on Mon/Thurs, 4 mg on all other days   Heparin level therapeutic this morning at 0.43. CBC wnl. No active bleed issues reported.  Goal of Therapy:  Heparin level 0.3-0.7 units/ml Monitor platelets by anticoagulation protocol: Yes   Plan:  Continue heparin at 1600 units/hr Monitor daily heparin level and CBC, s/sx  bleeding F/U restarting warfarin after procedure   12/24, PharmD, BCPS Please check AMION for all [facility] contact numbers Clinical Pharmacist 07/13/2020 10:00 AM

## 2020-07-13 NOTE — Transfer of Care (Signed)
Immediate Anesthesia Transfer of Care Note  Patient: Manuel Turner  Procedure(s) Performed: RIGHT GREAT TOE AMPUTATION (Right )  Patient Location: PACU  Anesthesia Type:MAC and Regional  Level of Consciousness: patient cooperative and responds to stimulation  Airway & Oxygen Therapy: Patient Spontanous Breathing  Post-op Assessment: Report given to RN and Post -op Vital signs reviewed and stable  Post vital signs: Reviewed and stable  Last Vitals:  Vitals Value Taken Time  BP    Temp    Pulse    Resp    SpO2      Last Pain:  Vitals:   07/13/20 1002  TempSrc:   PainSc: 0-No pain      Patients Stated Pain Goal: 2 (97/35/32 9924)  Complications: No complications documented.

## 2020-07-13 NOTE — Progress Notes (Signed)
Verbal order given by Dr. Lajoyce Corners to restart heparin drip.

## 2020-07-13 NOTE — Anesthesia Preprocedure Evaluation (Addendum)
Anesthesia Evaluation  Patient identified by MRN, date of birth, ID band Patient awake    Reviewed: Allergy & Precautions, NPO status , Patient's Chart, lab work & pertinent test results, reviewed documented beta blocker date and time   History of Anesthesia Complications Negative for: history of anesthetic complications  Airway Mallampati: II  TM Distance: >3 FB Neck ROM: Full    Dental  (+) Dental Advisory Given   Pulmonary former smoker,    Pulmonary exam normal        Cardiovascular hypertension, Pt. on home beta blockers and Pt. on medications + dysrhythmias Atrial Fibrillation  Rhythm:Irregular Rate:Normal     Neuro/Psych negative neurological ROS     GI/Hepatic negative GI ROS, Neg liver ROS,   Endo/Other  diabetes, Type 2, Oral Hypoglycemic Agents  Renal/GU negative Renal ROS     Musculoskeletal negative musculoskeletal ROS (+)   Abdominal   Peds  Hematology  On coumadin    Anesthesia Other Findings Covid test negative   Reproductive/Obstetrics                            Anesthesia Physical Anesthesia Plan  ASA: III  Anesthesia Plan: Regional   Post-op Pain Management:    Induction: Intravenous  PONV Risk Score and Plan: 1 and Propofol infusion and Treatment may vary due to age or medical condition  Airway Management Planned: Natural Airway and Simple Face Mask  Additional Equipment: None  Intra-op Plan:   Post-operative Plan:   Informed Consent: I have reviewed the patients History and Physical, chart, labs and discussed the procedure including the risks, benefits and alternatives for the proposed anesthesia with the patient or authorized representative who has indicated his/her understanding and acceptance.       Plan Discussed with: Anesthesiologist and CRNA  Anesthesia Plan Comments:        Anesthesia Quick Evaluation

## 2020-07-13 NOTE — Progress Notes (Signed)
Verbal order from Dr Lajoyce Corners to stop heparin drip.

## 2020-07-13 NOTE — Anesthesia Postprocedure Evaluation (Signed)
Anesthesia Post Note  Patient: Manuel Turner  Procedure(s) Performed: RIGHT GREAT TOE AMPUTATION (Right )     Patient location during evaluation: PACU Anesthesia Type: Regional Level of consciousness: awake and alert Pain management: pain level controlled Vital Signs Assessment: post-procedure vital signs reviewed and stable Respiratory status: spontaneous breathing, nonlabored ventilation, respiratory function stable and patient connected to nasal cannula oxygen Cardiovascular status: stable and blood pressure returned to baseline Postop Assessment: no apparent nausea or vomiting Anesthetic complications: no   No complications documented.  Last Vitals:  Vitals:   07/13/20 1200 07/13/20 1215  BP: (!) 168/98 (!) 160/70  Pulse: 70 64  Resp: 16 16  Temp:    SpO2: 96% 98%    Last Pain:  Vitals:   07/13/20 1215  TempSrc:   PainSc: 0-No pain                 Shelton Silvas

## 2020-07-13 NOTE — Progress Notes (Signed)
Orthopedic Tech Progress Note Patient Details:  Manuel Turner 01/31/1946 [id number]  Ortho Devices Type of Ortho Device: Postop shoe/boot Ortho Device/Splint Location: RLE Ortho Device/Splint Interventions: Ordered, Application   Post Interventions Patient Tolerated: Well Instructions Provided: Care of device   Donald Pore 07/13/2020, 2:31 PM

## 2020-07-14 ENCOUNTER — Encounter (HOSPITAL_COMMUNITY): Payer: Self-pay | Admitting: Orthopedic Surgery

## 2020-07-14 DIAGNOSIS — Z7901 Long term (current) use of anticoagulants: Secondary | ICD-10-CM | POA: Diagnosis not present

## 2020-07-14 DIAGNOSIS — M869 Osteomyelitis, unspecified: Secondary | ICD-10-CM | POA: Diagnosis not present

## 2020-07-14 DIAGNOSIS — I482 Chronic atrial fibrillation, unspecified: Secondary | ICD-10-CM | POA: Diagnosis not present

## 2020-07-14 DIAGNOSIS — I160 Hypertensive urgency: Secondary | ICD-10-CM | POA: Diagnosis not present

## 2020-07-14 LAB — BASIC METABOLIC PANEL
Anion gap: 12 (ref 5–15)
BUN: 17 mg/dL (ref 8–23)
CO2: 20 mmol/L — ABNORMAL LOW (ref 22–32)
Calcium: 8.5 mg/dL — ABNORMAL LOW (ref 8.9–10.3)
Chloride: 102 mmol/L (ref 98–111)
Creatinine, Ser: 1.1 mg/dL (ref 0.61–1.24)
GFR, Estimated: >60 mL/min (ref >60)
Glucose, Bld: 144 mg/dL — ABNORMAL HIGH (ref 70–99)
Potassium: 3.7 mmol/L (ref 3.5–5.1)
Sodium: 134 mmol/L — ABNORMAL LOW (ref 135–145)

## 2020-07-14 LAB — GLUCOSE, CAPILLARY
Glucose-Capillary: 137 mg/dL — ABNORMAL HIGH (ref 70–99)
Glucose-Capillary: 163 mg/dL — ABNORMAL HIGH (ref 70–99)
Glucose-Capillary: 175 mg/dL — ABNORMAL HIGH (ref 70–99)
Glucose-Capillary: 126 mg/dL — ABNORMAL HIGH (ref 70–99)

## 2020-07-14 LAB — HEPARIN LEVEL (UNFRACTIONATED): Heparin Unfractionated: 0.17 IU/mL — ABNORMAL LOW (ref 0.30–0.70)

## 2020-07-14 LAB — CBC
HCT: 42 % (ref 39.0–52.0)
Hemoglobin: 14.1 g/dL (ref 13.0–17.0)
MCH: 30.3 pg (ref 26.0–34.0)
MCHC: 33.6 g/dL (ref 30.0–36.0)
MCV: 90.3 fL (ref 80.0–100.0)
Platelets: 182 10*3/uL (ref 150–400)
RBC: 4.65 MIL/uL (ref 4.22–5.81)
RDW: 12.4 % (ref 11.5–15.5)
WBC: 7.7 10*3/uL (ref 4.0–10.5)
nRBC: 0 % (ref 0.0–0.2)

## 2020-07-14 LAB — PROTIME-INR
INR: 1.2 (ref 0.8–1.2)
Prothrombin Time: 15 seconds (ref 11.4–15.2)

## 2020-07-14 MED ORDER — APIXABAN 5 MG PO TABS
5.0000 mg | ORAL_TABLET | Freq: Two times a day (BID) | ORAL | Status: DC
  Administered 2020-07-14 – 2020-07-15 (×3): 5 mg via ORAL
  Filled 2020-07-14 (×3): qty 1

## 2020-07-14 MED ORDER — DIGOXIN 125 MCG PO TABS: 0.2500 mg | ORAL_TABLET | Freq: Every day | ORAL | Status: DC

## 2020-07-14 MED ORDER — LOSARTAN POTASSIUM 50 MG PO TABS: 100.0000 mg | ORAL_TABLET | Freq: Every day | ORAL | Status: DC

## 2020-07-14 NOTE — Progress Notes (Signed)
[facility] PROGRESS NOTE    Progress Note  Manuel Turner  UKG:[id number] DOB: 05/01/1946 DOA: 07/10/2020 PCP: Jarrett Soho, PA-C     Brief Narrative:   Manuel Turner is an 74 y.o. male chronic atrial fibrillation on Coumadin, essential hypertension, diabetes mellitus type 2 presented with nonhealing right great toe ulcer, orthopedic was consulted and he was scheduled for surgery on 07/13/2020, but patient adamantly refusing to go through it, but subsequently agreed.  Assessment/Plan:   Osteomyelitis of toe (HCC) on the right foot/diabetic foot ulcer: Imaging from Jan 06, 2020 showed osteomyelitis. He was started empirically on Vanco and Zosyn, orthopedic surgery was consulted and he status post right great toe amputation on 07/13/2020. Physical therapy evaluated the patient the recommended skilled nursing facility versus 24-hour supervision. Continue IV vancomycin and Zosyn per orthopedic surgery.  Hypertensive urgency: She was not taking his medications at home. He was restarted on Coreg losartan and hydralazine was added as needed. Pressure significantly elevated today we will restart his ARB.  Chronic fibrillation on anticoagulation: INR was low on admission 1.5 Coumadin held for orthopedic procedure. Restarted IV heparin, I had a long discussion with the family they would like to be placed on a NOAC. We will discuss with orthopedic surgery when can restart Eliquis.  Lower extremity swelling: Chest x-ray shows no gross fluid overload. Continue strict I's and O's and daily weights. No lower extremity swelling negative JVD.  Hypokalemia/hyponatremia: Repleted now resolved.  Diabetes mellitus type 2: Holding Metformin on admission. Hemoglobin A1c of 7.0. Continue sliding scale insulin.  Possible undiagnosed dementia/acute confusional state/delirium: He is at high risk of developing acute confusional state due to sedatives or medication. We will have a low  threshold to use Haldol IV as needed continue Seroquel at night.   DVT prophylaxis: heparin now on hold for Surgery Family Communication:daughters Status is: Inpatient  Remains inpatient appropriate because:Hemodynamically unstable   Dispo: The patient is from: Home              Anticipated d/c is to: Home              Anticipated d/c date is: 2 days              Patient currently is not medically stable to d/c.     Code Status:     Code Status Orders  (From admission, onward)         Start     Ordered   07/10/20 1407  Full code  Continuous        07/10/20 1408        Code Status History    This patient has a current code status but no historical code status.   Advance Care Planning Activity    Advance Directive Documentation     Most Recent Value  Type of Advance Directive Living will  Pre-existing out of facility DNR order (yellow form or pink MOST form) --  "MOST" Form in Place? --        IV Access:    Peripheral IV   Procedures and diagnostic studies:   No results found.   Medical Consultants:    None.  Anti-Infectives:   zosyn  Subjective:    Manuel Turner patient relates his pain is controlled.  Objective:    Vitals:   07/13/20 1513 07/13/20 2026 07/13/20 2101 07/14/20 0653  BP: (!) 132/92 (!) 164/95 (!) 144/90 (!) 170/67  Pulse: (!) 45 88 87 (!) 50  Resp: 17 17  16   Temp: (!) 97.5 F (36.4 C) 98.3 F (36.8 C)  98.1 F (36.7 C)  TempSrc: Oral Axillary  Oral  SpO2: 99% 99%  100%  Weight:      Height:       SpO2: 100 %   Intake/Output Summary (Last 24 hours) at 07/14/2020 0808 Last data filed at 07/14/2020 0300 Gross per 24 hour  Intake 84.23 ml  Output 500 ml  Net -415.77 ml   Filed Weights   07/10/20 1108  Weight: 74.8 kg    Exam: General exam: In no acute distress. Respiratory system: Good air movement and clear to auscultation. Cardiovascular system: S1 & S2 heard, RRR. No JVD. Gastrointestinal system:  Abdomen is nondistended, soft and nontender.  Extremities: No pedal edema. Skin: No rashes, lesions or ulcers  Data Reviewed:    Labs: Basic Metabolic Panel: Recent Labs  Lab 07/08/20 1737 07/08/20 1737 07/10/20 1139 07/10/20 1139 07/11/20 0441 07/11/20 0441 07/13/20 0611 07/14/20 0102  NA 134*  --  134*  --  135  --  135 134*  K 3.2*   < > 3.4*   < > 3.5   < > 3.8 3.7  CL 99  --  99  --  99  --  100 102  CO2 26  --  25  --  22  --  25 20*  GLUCOSE 136*  --  154*  --  140*  --  155* 144*  BUN 14  --  18  --  15  --  11 17  CREATININE 0.86  --  0.95  --  0.84  --  1.05 1.10  CALCIUM 9.0  --  9.2  --  9.1  --  9.1 8.5*  MG  --   --   --   --  1.7  --  1.7  --    < > = values in this interval not displayed.   GFR Estimated Creatinine Clearance: 64.2 mL/min (by C-G formula based on SCr of 1.1 mg/dL). Liver Function Tests: Recent Labs  Lab 07/10/20 1139 07/13/20 0611  AST 22 31  ALT 22 31  ALKPHOS 81 75  BILITOT 0.7 0.5  PROT 7.1 6.2*  ALBUMIN 3.8 3.3*   No results for input(s): LIPASE, AMYLASE in the last 168 hours. No results for input(s): AMMONIA in the last 168 hours. Coagulation profile Recent Labs  Lab 07/10/20 1637 07/12/20 0445 07/13/20 0611 07/14/20 0102  INR 1.1 1.2 1.2 1.2   COVID-19 Labs  No results for input(s): DDIMER, FERRITIN, LDH, CRP in the last 72 hours.  Lab Results  Component Value Date   SARSCOV2NAA NEGATIVE 07/10/2020    CBC: Recent Labs  Lab 07/08/20 1737 07/08/20 1737 07/10/20 1139 07/11/20 0441 07/12/20 0445 07/13/20 0611 07/14/20 0102  WBC 6.6   < > 6.6 6.6 6.8 8.2 7.7  NEUTROABS 4.5  --  5.0  --   --   --   --   HGB 14.5   < > 14.3 13.9 14.6 15.0 14.1  HCT 43.0   < > 42.5 41.0 43.8 44.8 42.0  MCV 88.5   < > 89.3 88.0 90.1 90.3 90.3  PLT 226   < > 237 204 215 214 182   < > = values in this interval not displayed.   Cardiac Enzymes: No results for input(s): CKTOTAL, CKMB, CKMBINDEX, TROPONINI in the last 168  hours. BNP (last 3 results) No results for input(s): PROBNP  in the last 8760 hours. CBG: Recent Labs  Lab 07/13/20 0638 07/13/20 1148 07/13/20 1601 07/13/20 2057 07/14/20 0652  GLUCAP 137* 139* 125* 196* 137*   D-Dimer: No results for input(s): DDIMER in the last 72 hours. Hgb A1c: No results for input(s): HGBA1C in the last 72 hours. Lipid Profile: No results for input(s): CHOL, HDL, LDLCALC, TRIG, CHOLHDL, LDLDIRECT in the last 72 hours. Thyroid function studies: No results for input(s): TSH, T4TOTAL, T3FREE, THYROIDAB in the last 72 hours.  Invalid input(s): FREET3 Anemia work up: No results for input(s): VITAMINB12, FOLATE, FERRITIN, TIBC, IRON, RETICCTPCT in the last 72 hours. Sepsis Labs: Recent Labs  Lab 07/10/20 1139 07/10/20 1139 07/11/20 0441 07/12/20 0445 07/13/20 0611 07/14/20 0102  WBC 6.6   < > 6.6 6.8 8.2 7.7  LATICACIDVEN 0.9  --   --   --   --   --    < > = values in this interval not displayed.   Microbiology Recent Results (from the past 240 hour(s))  Blood Cultures x 2 sites     Status: None (Preliminary result)   Collection Time: 07/10/20 11:40 AM   Specimen: BLOOD  Result Value Ref Range Status   Specimen Description BLOOD RIGHT ANTECUBITAL  Final   Special Requests   Final    BOTTLES DRAWN AEROBIC AND ANAEROBIC Blood Culture results may not be optimal due to an excessive volume of blood received in culture bottles   Culture   Final    NO GROWTH 4 DAYS Performed at [facility], [address] [place], [place] [address]    Report Status PENDING  Incomplete  Blood Cultures x 2 sites     Status: None (Preliminary result)   Collection Time: 07/10/20 11:50 AM   Specimen: BLOOD LEFT FOREARM  Result Value Ref Range Status   Specimen Description BLOOD LEFT FOREARM  Final   Special Requests   Final    BOTTLES DRAWN AEROBIC AND ANAEROBIC Blood Culture results may not be optimal due to an excessive volume of blood received in culture  bottles   Culture   Final    NO GROWTH 4 DAYS Performed at [facility], [address] [place], [place] [address]    Report Status PENDING  Incomplete  Respiratory Panel by RT PCR (Flu A&B, Covid) - Nasopharyngeal Swab     Status: None   Collection Time: 07/10/20 12:08 PM   Specimen: Nasopharyngeal Swab; Nasopharyngeal(NP) swabs in vial transport medium  Result Value Ref Range Status   SARS Coronavirus 2 by RT PCR NEGATIVE NEGATIVE Final    Comment: (NOTE) SARS-CoV-2 target nucleic acids are NOT DETECTED.  The SARS-CoV-2 RNA is generally detectable in upper respiratoy specimens during the acute phase of infection. The lowest concentration of SARS-CoV-2 viral copies this assay can detect is 131 copies/mL. A negative result does not preclude SARS-Cov-2 infection and should not be used as the sole basis for treatment or other patient management decisions. A negative result may occur with  improper specimen collection/handling, submission of specimen other than nasopharyngeal swab, presence of viral mutation(s) within the areas targeted by this assay, and inadequate number of viral copies (<131 copies/mL). A negative result must be combined with clinical observations, patient history, and epidemiological information. The expected result is Negative.  Fact Sheet for Patients:  https://www.moore.com/  Fact Sheet for Healthcare Providers:  https://www.young.biz/  This test is no t yet approved or cleared by the [place] FDA and  has  been authorized for detection and/or diagnosis of SARS-CoV-2 by FDA under an Emergency Use Authorization (EUA). This EUA will remain  in effect (meaning this test can be used) for the duration of the COVID-19 declaration under Section 564(b)(1) of the Act, 21 U.S.C. section 360bbb-3(b)(1), unless the authorization is terminated or revoked sooner.     Influenza A by PCR NEGATIVE NEGATIVE Final    Influenza B by PCR NEGATIVE NEGATIVE Final    Comment: (NOTE) The Xpert Xpress SARS-CoV-2/FLU/RSV assay is intended as an aid in  the diagnosis of influenza from Nasopharyngeal swab specimens and  should not be used as a sole basis for treatment. Nasal washings and  aspirates are unacceptable for Xpert Xpress SARS-CoV-2/FLU/RSV  testing.  Fact Sheet for Patients: https://www.moore.com/  Fact Sheet for Healthcare Providers: https://www.young.biz/  This test is not yet approved or cleared by the [place] FDA and  has been authorized for detection and/or diagnosis of SARS-CoV-2 by  FDA under an Emergency Use Authorization (EUA). This EUA will remain  in effect (meaning this test can be used) for the duration of the  Covid-19 declaration under Section 564(b)(1) of the Act, 21  U.S.C. section 360bbb-3(b)(1), unless the authorization is  terminated or revoked. Performed at [facility], [address], [place], [place] [address]   Blood Cultures x 2 sites     Status: None (Preliminary result)   Collection Time: 07/10/20  2:38 PM   Specimen: BLOOD RIGHT HAND  Result Value Ref Range Status   Specimen Description BLOOD RIGHT HAND  Final   Special Requests   Final    BOTTLES DRAWN AEROBIC AND ANAEROBIC Blood Culture results may not be optimal due to an inadequate volume of blood received in culture bottles   Culture   Final    NO GROWTH 4 DAYS Performed at [facility], [address], [place], [place] [address]    Report Status PENDING  Incomplete  Blood Cultures x 2 sites     Status: None (Preliminary result)   Collection Time: 07/10/20  2:43 PM   Specimen: BLOOD LEFT HAND  Result Value Ref Range Status   Specimen Description BLOOD LEFT HAND  Final   Special Requests   Final    BOTTLES DRAWN AEROBIC AND ANAEROBIC Blood Culture adequate volume   Culture   Final    NO GROWTH 4 DAYS Performed at [facility], [address] [place], [place] [address]    Report Status PENDING  Incomplete  Surgical pcr screen     Status: Abnormal   Collection Time: 07/11/20  5:00 PM   Specimen: Nasal Mucosa; Nasal Swab  Result Value Ref Range Status   MRSA, PCR NEGATIVE NEGATIVE Final   Staphylococcus aureus POSITIVE (A) NEGATIVE Final    Comment: (NOTE) The Xpert SA Assay (FDA approved for NASAL specimens in patients 59 years of age and older), is one component of a comprehensive surveillance program. It is not intended to diagnose infection nor to guide or monitor treatment. Performed at [facility], [address] [place], [place] [address]      Medications:   . amLODipine  5 mg Oral Daily  . atorvastatin  20 mg Oral Daily  . carvedilol  12.5 mg Oral BID  . Chlorhexidine Gluconate Cloth  6 each Topical Q0600  . docusate sodium  100 mg Oral BID  . insulin aspart  0-6 Units Subcutaneous TID WC  . losartan  100  mg Oral Daily  . mupirocin ointment  1 application Nasal BID  . polyethylene glycol  17 g Oral Daily  . QUEtiapine  25 mg Oral QHS  . sodium chloride flush  3 mL Intravenous Q12H   Continuous Infusions: . sodium chloride Stopped (07/13/20 2221)  . heparin 1,800 Units/hr (07/14/20 0501)  . methocarbamol (ROBAXIN) IV    . piperacillin-tazobactam (ZOSYN)  IV 3.375 g (07/14/20 0557)      LOS: 4 days   Marinda Elk  [facility]  07/14/2020, 8:08 AM

## 2020-07-14 NOTE — Progress Notes (Signed)
ANTICOAGULATION CONSULT NOTE - Follow Up Consult  Pharmacy Consult for heparin Indication: atrial fibrillation  Labs: Recent Labs    07/12/20 0445 07/12/20 0445 07/13/20 0611 07/14/20 0102  HGB 14.6   < > 15.0 14.1  HCT 43.8  --  44.8 42.0  PLT 215  --  214 182  LABPROT 14.3  --  14.2 15.0  INR 1.2  --  1.2 1.2  HEPARINUNFRC 0.35  --  0.43 0.17*  CREATININE  --   --  1.05 1.10   < > = values in this interval not displayed.    Assessment: 74yo male subtherapeutic on heparin after two levels at goal, paused for surgery and resumed yesterday afternoon; no gtt issues or signs of bleeding per RN.  Goal of Therapy:  Heparin level 0.3-0.7 units/ml   Plan:  Will increase heparin gtt by 3 units/kg/hr to 1800 units/hr and check level in 6-8 hours.    Vernard Gambles, PharmD, BCPS  07/14/2020,4:45 AM

## 2020-07-14 NOTE — Plan of Care (Signed)
  Problem: Education: Goal: Knowledge of General Education information will improve Description: Including pain rating scale, medication(s)/side effects and non-pharmacologic comfort measures Outcome: Progressing   Problem: Clinical Measurements: Goal: Ability to maintain clinical measurements within normal limits will improve Outcome: Progressing   Problem: Coping: Goal: Level of anxiety will decrease Outcome: Progressing   Problem: Pain Managment: Goal: General experience of comfort will improve Outcome: Progressing   Problem: Safety: Goal: Ability to remain free from injury will improve Outcome: Progressing   Problem: Skin Integrity: Goal: Risk for impaired skin integrity will decrease Outcome: Progressing   

## 2020-07-14 NOTE — Progress Notes (Signed)
ANTICOAGULATION CONSULT NOTE - Initial Consult  Pharmacy Consult for apixaban Indication: atrial fibrillation  No Known Allergies  Patient Measurements: Height: 6\' 2"  (188 cm) Weight: 74.8 kg (165 lb) IBW/kg (Calculated) : 82.2  Vital Signs: Temp: 98.1 F (36.7 C) (11/25 0653) Temp Source: Oral (11/25 0653) BP: 170/67 (11/25 0653) Pulse Rate: 50 (11/25 0653)  Labs: Recent Labs    07/12/20 0445 07/12/20 0445 07/13/20 0611 07/14/20 0102  HGB 14.6   < > 15.0 14.1  HCT 43.8  --  44.8 42.0  PLT 215  --  214 182  LABPROT 14.3  --  14.2 15.0  INR 1.2  --  1.2 1.2  HEPARINUNFRC 0.35  --  0.43 0.17*  CREATININE  --   --  1.05 1.10   < > = values in this interval not displayed.    Estimated Creatinine Clearance: 64.2 mL/min (by C-G formula based on SCr of 1.1 mg/dL).   Medical History: Past Medical History:  Diagnosis Date  . Atrial fibrillation (HCC)   . Diabetes mellitus without complication (HCC)   . Hyperlipemia   . Hypertension     Medications:  Scheduled:  . amLODipine  5 mg Oral Daily  . apixaban  5 mg Oral BID  . atorvastatin  20 mg Oral Daily  . carvedilol  12.5 mg Oral BID  . Chlorhexidine Gluconate Cloth  6 each Topical Q0600  . docusate sodium  100 mg Oral BID  . insulin aspart  0-6 Units Subcutaneous TID WC  . losartan  100 mg Oral Daily  . mupirocin ointment  1 application Nasal BID  . polyethylene glycol  17 g Oral Daily  . QUEtiapine  25 mg Oral QHS  . sodium chloride flush  3 mL Intravenous Q12H    Assessment: 74 yr old male who presented to the ED with osteomyelitis s/p amputation 11/24. Patient takes warfarin at home for afib (6mg  on Mon/Thurs, 4mg  on all other days). Patient initially transitioned to heparin while holding warfarin pending his amputation. Heparin level was subtherapeutic overnight after holding for surgery yesterday. No signs/symptoms of bleeding note.  Dr. 12/24 discussed oral anticoagulation with family/patient and all  have decided to transition to a DOAC. Will initiate apixaban 5mg  BID today and provide education before discharge.    Goal of Therapy:  Monitor platelets by anticoagulation protocol: Yes   Plan:  Discontinue heparin infusion Initiate apixaban 5mg  BID  , PharmD PGY2 ID Pharmacy Resident Phone between 7 am - 3:30 pm:  Please check AMION for all [facility] phone numbers After 10:00 PM, call Main Pharmacy [phone]  07/14/2020,9:02 AM

## 2020-07-14 NOTE — Progress Notes (Signed)
Patient ID: Manuel Turner, male   DOB: 1945/09/11, 74 y.o.   MRN: [id number] Patient is status post amputation of the great toe right foot the dressing is dry.  Patient is safe from an orthopedic standpoint for discharge to home.  I doubt that patient will be able to maintain protected weightbearing on the right foot so discharge at this time may be beneficial to get patient back into his home environment so he is more oriented.  Discharge with home health physical therapy I will follow-up in a week to change the dressing.

## 2020-07-15 DIAGNOSIS — I160 Hypertensive urgency: Secondary | ICD-10-CM | POA: Diagnosis not present

## 2020-07-15 DIAGNOSIS — I10 Essential (primary) hypertension: Secondary | ICD-10-CM

## 2020-07-15 DIAGNOSIS — M869 Osteomyelitis, unspecified: Secondary | ICD-10-CM | POA: Diagnosis not present

## 2020-07-15 DIAGNOSIS — I482 Chronic atrial fibrillation, unspecified: Secondary | ICD-10-CM | POA: Diagnosis not present

## 2020-07-15 LAB — GLUCOSE, CAPILLARY
Glucose-Capillary: 151 mg/dL — ABNORMAL HIGH (ref 70–99)
Glucose-Capillary: 198 mg/dL — ABNORMAL HIGH (ref 70–99)
Glucose-Capillary: 161 mg/dL — ABNORMAL HIGH (ref 70–99)

## 2020-07-15 LAB — CBC
HCT: 42.2 % (ref 39.0–52.0)
Hemoglobin: 13.8 g/dL (ref 13.0–17.0)
MCH: 29.6 pg (ref 26.0–34.0)
MCHC: 32.7 g/dL (ref 30.0–36.0)
MCV: 90.4 fL (ref 80.0–100.0)
Platelets: 186 10*3/uL (ref 150–400)
RBC: 4.67 MIL/uL (ref 4.22–5.81)
RDW: 12.5 % (ref 11.5–15.5)
WBC: 6.4 10*3/uL (ref 4.0–10.5)
nRBC: 0 % (ref 0.0–0.2)

## 2020-07-15 LAB — BASIC METABOLIC PANEL
Anion gap: 9 (ref 5–15)
BUN: 17 mg/dL (ref 8–23)
CO2: 23 mmol/L (ref 22–32)
Calcium: 8.7 mg/dL — ABNORMAL LOW (ref 8.9–10.3)
Chloride: 101 mmol/L (ref 98–111)
Creatinine, Ser: 0.92 mg/dL (ref 0.61–1.24)
GFR, Estimated: >60 mL/min (ref >60)
Glucose, Bld: 188 mg/dL — ABNORMAL HIGH (ref 70–99)
Potassium: 3.5 mmol/L (ref 3.5–5.1)
Sodium: 133 mmol/L — ABNORMAL LOW (ref 135–145)

## 2020-07-15 LAB — CULTURE, BLOOD (ROUTINE X 2)
Culture: NO GROWTH
Culture: NO GROWTH
Culture: NO GROWTH
Culture: NO GROWTH
Special Requests: ADEQUATE

## 2020-07-15 MED ORDER — APIXABAN 5 MG PO TABS
5.0000 mg | ORAL_TABLET | Freq: Two times a day (BID) | ORAL | 2 refills | Status: DC
Start: 2020-07-15 — End: 2020-07-19

## 2020-07-15 MED ORDER — AMLODIPINE BESYLATE 5 MG PO TABS
5.0000 mg | ORAL_TABLET | Freq: Every day | ORAL | 0 refills | Status: DC
Start: 2020-07-15 — End: 2020-07-19

## 2020-07-15 NOTE — Progress Notes (Signed)
Discharge instructions addressed; Pt in stable condition;Pt.'s daughter picked him up at the [employer] entrance.

## 2020-07-15 NOTE — TOC Initial Note (Addendum)
Transition of Care [facility]) - Initial/Assessment Note    Patient Details  Name: Manuel Turner MRN: [id number] Date of Birth: 03-22-1946  Transition of Care [facility] CM/SW Contact:    Curlene Labrum, RN Phone Number: 07/15/2020, 9:36 AM  Clinical Narrative:                 Case management met with the patient and wife at the bedside regarding transitions of care to home today.  The patient is a S/P Right great toe amputation and plans to be discharged back home to live with his wife - who is providing 24 hour supervision.  The patient's wife was given Medicare choice regarding home health services and she does not have a preference - but states that she has had [facility] in the past and was pleased with services provided.  I called [facility] and spoke with Delton Coombes, RNCM to ask for home health PT - will return my call if able to provide PT.  The patient currently has a wheelchair at home and the wife states that she should like a regular RW with wheels.  A RW with wheels was provided and delivered to the room through [facility].  Patient will be able to be discharged today and I will document the home health agency in the discharge instructions for the wife.  07/15/2020 [place], RNCM with [facility] called back and accepted the patient for home health services - placed in discharge instructions for the patient's wife.  Expected Discharge Plan: [place] Barriers to Discharge: No Barriers Identified   Patient Goals and CMS Choice Patient states their goals for this hospitalization and ongoing recovery are:: Patient plans to discharge home with his wife today. CMS Medicare.gov Compare Post Acute Care list provided to:: Patient Represenative (must comment) (Patient's wife, Vaughan Basta) Choice offered to / list presented to : Spouse  Expected Discharge Plan and Services Expected Discharge Plan: [place]   Discharge Planning  Services: CM Consult Post Acute Care Choice: Durable Medical Equipment, Home Health Living arrangements for the past 2 months: Single Family Home Expected Discharge Date: 07/15/20               DME Arranged: Gilford Rile rolling DME Agency: [employer] Date DME Agency Contacted: 07/15/20 Time DME Agency Contacted: [phone] Representative spoke with at DME Agency: [place] - supplied and charged from the [place] storage closet - chart sheet given to secretary [place] Arranged: PT [place]: [facility] (formerly [employer]) Date [place]: 07/15/20 Time [place]: [phone] Representative spoke with at [place]: Drue Novel, [place]  Prior Living Arrangements/Services Living arrangements for the past 2 months: [place] with:: Spouse Patient language and need for interpreter reviewed:: Yes Do you feel safe going back to the place where you live?: Yes      Need for Family Participation in Patient Care: Yes (Comment) Care giver support system in place?: Yes (comment) Current home services: DME (Patient currently has a wheelchair at the house.) Criminal Activity/Legal Involvement Pertinent to Current Situation/Hospitalization: No - Comment as needed  Activities of Daily Living Home Assistive Devices/Equipment: None ADL Screening (condition at time of admission) Patient's cognitive ability adequate to safely complete daily activities?: No Is the patient deaf or have difficulty hearing?: No Does the patient have difficulty seeing, even when wearing glasses/contacts?: No Does the patient have difficulty concentrating, remembering, or making  decisions?: No Patient able to express need for assistance with ADLs?: Yes Does the patient have difficulty dressing or bathing?: No Independently performs ADLs?: Yes (appropriate for developmental age) Does the patient have difficulty walking or climbing stairs?: No Weakness of Legs: Right Weakness of Arms/Hands:  None  Permission Sought/Granted Permission sought to share information with : Case Manager Permission granted to share information with : Yes, Verbal Permission Granted     Permission granted to share info w AGENCY: [facility] for PT  Permission granted to share info w Relationship: wife, Vaughan Basta     Emotional Assessment Appearance:: Appears stated age Attitude/Demeanor/Rapport: Gracious Affect (typically observed): Pleasant Orientation: : Oriented to Self, Oriented to Place, Oriented to Situation, Oriented to  Time, Fluctuating Orientation (Suspected and/or reported [place]) (Patient oriented this morning - but history of dementia) Alcohol / Substance Use: Not Applicable Psych Involvement: No (comment)  Admission diagnosis:  Osteomyelitis of toe ([place]) [M86.9] Osteomyelitis of right foot, unspecified type ([place]) [M86.9] Hypertension, unspecified type [I10] Patient Active Problem List   Diagnosis Date Noted  . Osteomyelitis of right foot ([place])   . Osteomyelitis of toe ([place]) 07/10/2020  . Hypertensive urgency 07/10/2020  . Hypokalemia 07/10/2020  . Hyponatremia 07/10/2020  . Type 2 diabetes mellitus with foot ulcer ([place]) 07/10/2020  . Encounter for therapeutic drug monitoring 12/17/2019  . (HFpEF) heart failure with preserved ejection fraction ([place]) 12/07/2014  . Atrial fibrillation, chronic ([place]) 03/16/2014  . Essential hypertension 03/16/2014  . Chronic anticoagulation 03/16/2014  . Bilateral lower extremity edema 03/16/2014   PCP:  Marda Stalker, PA-C Pharmacy:   [id number] [address]: [phone]: [address] [place] [place]Phone: [phone]: [phone] [facility] [place] [address]Phone: [phone]: [phone]    Social Determinants of Health (SDOH) Interventions    Readmission Risk Interventions Readmission Risk Prevention Plan 07/15/2020  Post Dischage Appt Complete  Medication Screening Complete  Transportation Screening Complete  Some recent data might be hidden

## 2020-07-15 NOTE — Discharge Summary (Signed)
Physician Discharge Summary  Manuel Turner Caudell ZOX:[id number] DOB: Sep 28, 1945 DOA: 07/10/2020  PCP: Jarrett Soho, PA-C  Admit date: 07/10/2020 Discharge date: 07/15/2020  Admitted From: Home Disposition:  Home  Recommendations for Outpatient Follow-up:  1. Follow up with PCP in 1-2 weeks 2. Please obtain BMP/CBC in one week   Home Health:yes Equipment/Devices:ROlling walker  Discharge Condition:Stable CODE STATUS:Full Diet recommendation: Heart Healthy  Brief/Interim Summary: 74 y.o. male chronic atrial fibrillation on Coumadin, essential hypertension, diabetes mellitus type 2 presented with nonhealing right great toe ulcer, orthopedic was consulted and he was scheduled for surgery on 07/13/2020, but patient adamantly refusing to go through it, but subsequently agreed  Discharge Diagnoses:  Principal Problem:   Osteomyelitis of toe (HCC) Active Problems:   Atrial fibrillation, chronic (HCC)   Chronic anticoagulation   Bilateral lower extremity edema   Hypertensive urgency   Hypokalemia   Hyponatremia   Type 2 diabetes mellitus with foot ulcer (HCC)   Osteomyelitis of right foot (HCC)  Osteomyelitis of the great right toe/diabetic foot: Imaging from Jan 06, 2020 showed osteomyelitis. He was admitted to the hospital started on IV vancomycin and Zosyn and surgery was consulted to perform a right great toe amputation 07/13/2020 with clean margins antibiotics were discontinued, he remained afebrile with no leukocytosis. Physical therapy evaluated the patient recommended skilled nursing facility, after discussion the family wanted to take the patient home as they can provide 24-hour assistance.  Hypertensive urgency: Patient was not taking his medications at home he was restarted on his Coreg, losartan and hydralazine his blood pressure remained relatively well-controlled no change made to his medication. We will sign home health nurse to address his medications.  Chronic  atrial fibrillation: INR was low on admission, it was discussed his Coumadin was held for surgery and he agreed to proceed with a changes from Coumadin to Eliquis. He was started here in the hospital on Eliquis which she will continue at home.  Lower extremity swelling: Chest x-ray showed no gross fluid overload lower extremity Doppler negative for DVT.  Electrolyte imbalance: They were corrected now resolved.  Diabetes mellitus type 2: His Metformin was held on admission he will resume it as an outpatient will need a hemoglobin A1c check in 3 weeks.  Possible undiagnosed dementia/acute confusional state: During the hospital stay he became agitated had to be Seroquel at bedtime which improved his acute confusional state. He will need to be further evaluated as an outpatient by his PCP.  Discharge Instructions  Discharge Instructions    Diet - low sodium heart healthy   Complete by: As directed    Increase activity slowly   Complete by: As directed    No wound care   Complete by: As directed    Post-op shoe   Complete by: As directed    Touch down weight bearing   Complete by: As directed    Laterality: right   Extremity: Lower     Allergies as of 07/15/2020   No Known Allergies     Medication List    STOP taking these medications   amoxicillin-clavulanate 875-125 MG tablet Commonly known as: Augmentin   meloxicam 15 MG tablet Commonly known as: MOBIC   warfarin 4 MG tablet Commonly known as: COUMADIN     TAKE these medications   amLODipine 5 MG tablet Commonly known as: NORVASC Take 1 tablet (5 mg total) by mouth daily.   apixaban 5 MG Tabs tablet Commonly known as: ELIQUIS Take 1 tablet (5 mg  total) by mouth 2 (two) times daily.   atorvastatin 20 MG tablet Commonly known as: LIPITOR Take 20 mg by mouth daily.   carvedilol 12.5 MG tablet Commonly known as: COREG Take 1 tablet (12.5 mg total) by mouth 2 (two) times daily.   diazepam 5 MG  tablet Commonly known as: VALIUM Take 2.5-5 mg by mouth as needed for anxiety.   digoxin 0.125 MG tablet Commonly known as: LANOXIN Take 2 tablets (0.25 mg total) by mouth daily.   losartan 100 MG tablet Commonly known as: COZAAR Take 100 mg by mouth daily.   metFORMIN 1000 MG tablet Commonly known as: GLUCOPHAGE Take 1,000 mg by mouth daily with breakfast.   tiZANidine 4 MG tablet Commonly known as: ZANAFLEX Take 4 mg by mouth every 8 (eight) hours as needed for muscle spasms.            Discharge Care Instructions  (From admission, onward)         Start     Ordered   07/14/20 0000  Touch down weight bearing       Question Answer Comment  Laterality right   Extremity Lower      07/14/20 0836          Follow-up Information    Nadara Mustard, MD In 1 week.   Specialty: Orthopedic Surgery Contact information: [address] [place] [address] [phone]              No Known Allergies  Consultations:  Orthopedic surgery   Procedures/Studies: DG Chest 1 View  Result Date: 07/10/2020 CLINICAL DATA:  Preoperative evaluation.  Right foot osteomyelitis. EXAM: CHEST  1 VIEW COMPARISON:  None. FINDINGS: The heart size and mediastinal contours are within normal limits. Both lungs are clear. The visualized skeletal structures are unremarkable. IMPRESSION: No active disease. Electronically Signed   By: Duanne Guess D.O.   On: 07/10/2020 13:24   DG Foot Complete Right  Result Date: 07/08/2020 CLINICAL DATA:  Big toe infection. Patient is diabetic. No known injury. EXAM: RIGHT FOOT COMPLETE - 3+ VIEW COMPARISON:  None. FINDINGS: May be mild skin breakdown in the distal great toe. There is bony erosion of the distal tuft of the distal first phalanx best seen on oblique imaging. There is a focus of air projected over the soft tissues between the proximal first and second phalanges. No other evidence of osteomyelitis. No fractures. IMPRESSION: 1.  Probable mild skin breakdown of the distal soft tissues of the great toe. Mild erosion in the tuft of the distal first phalanx consistent with osteomyelitis. 2. There is a focus of air projected over the soft tissues between the proximal first and second phalanges. Given the findings of osteomyelitis, this gas could be from a gas-forming organism with resulting soft tissue gas. Electronically Signed   By: Gerome Sam III M.D   On: 07/08/2020 17:57   VAS [place] ABI WITH/WO TBI  Result Date: 07/11/2020 LOWER EXTREMITY DOPPLER STUDY Indications: Ulceration. High Risk Factors: Hypertension, hyperlipidemia, Diabetes.  Limitations: Today's exam was limited due to bandages. Comparison Study: no prior Performing Technologist: Blanch Media RVS  Examination Guidelines: A complete evaluation includes at minimum, Doppler waveform signals and systolic blood pressure reading at the level of bilateral brachial, anterior tibial, and posterior tibial arteries, when vessel segments are accessible. Bilateral testing is considered an integral part of a complete examination. Photoelectric Plethysmograph (PPG) waveforms and toe systolic pressure readings are included as required and additional duplex testing as  needed. Limited examinations for reoccurring indications may be performed as noted.  ABI Findings: +--------+------------------+-----+---------+--------+ Right   Rt Pressure (mmHg)IndexWaveform Comment  +--------+------------------+-----+---------+--------+ [id number]                    triphasic         +--------+------------------+-----+---------+--------+ PTA     250               1.30 triphasic         +--------+------------------+-----+---------+--------+ DP      246               1.28 triphasic         +--------+------------------+-----+---------+--------+ +--------+------------------+-----+---------+-------+ Left    Lt Pressure (mmHg)IndexWaveform Comment  +--------+------------------+-----+---------+-------+ [id number]                    triphasic        +--------+------------------+-----+---------+-------+ PTA     244               1.27 triphasic        +--------+------------------+-----+---------+-------+ DP      250               1.30 triphasic        +--------+------------------+-----+---------+-------+ +-------+-----------+-----------+------------+------------+ ABI/TBIToday's ABIToday's TBIPrevious ABIPrevious TBI +-------+-----------+-----------+------------+------------+ Right  1.30       bandages                            +-------+-----------+-----------+------------+------------+ Left   1.30                                           +-------+-----------+-----------+------------+------------+  Summary: Right: Resting right ankle-brachial index is within normal range. No evidence of significant right lower extremity arterial disease. ABIs may be unreliable. Left: Resting left ankle-brachial index is within normal range. No evidence of significant left lower extremity arterial disease. ABIs may be unreliable.  *See table(s) above for measurements and observations.  Electronically signed by Sherald Hess MD on 07/11/2020 at 4:40:34 PM.   Final      Subjective: No new complaints  Discharge Exam: Vitals:   07/15/20 0300 07/15/20 0802  BP: (!) 148/81 (!) 160/88  Pulse: 78 (!) 59  Resp: 17 18  Temp: 97.8 F (36.6 C) 97.8 F (36.6 C)  SpO2: 99% 99%   Vitals:   07/14/20 1218 07/14/20 2009 07/15/20 0300 07/15/20 0802  BP: 136/75 (!) 163/94 (!) 148/81 (!) 160/88  Pulse: 68 90 78 (!) 59  Resp: 16 18 17 18   Temp: (!) 97.5 F (36.4 C) 98 F (36.7 C) 97.8 F (36.6 C) 97.8 F (36.6 C)  TempSrc: Oral  Oral Oral  SpO2: 97% 100% 99% 99%  Weight:      Height:        General: Pt is alert, awake, not in acute distress Cardiovascular: RRR, S1/S2 +, no rubs, no gallops Respiratory: CTA bilaterally, no  wheezing, no rhonchi Abdominal: Soft, NT, ND, bowel sounds + Extremities: no edema, no cyanosis    The results of significant diagnostics from this hospitalization (including imaging, microbiology, ancillary and laboratory) are listed below for reference.     Microbiology: Recent Results (from the past 240 hour(s))  Blood Cultures x 2 sites     Status: None (Preliminary result)   Collection Time: 07/10/20 11:40 AM  Specimen: BLOOD  Result Value Ref Range Status   Specimen Description BLOOD RIGHT ANTECUBITAL  Final   Special Requests   Final    BOTTLES DRAWN AEROBIC AND ANAEROBIC Blood Culture results may not be optimal due to an excessive volume of blood received in culture bottles   Culture   Final    NO GROWTH 4 DAYS Performed at [facility], [address] [place], [place] [address]    Report Status PENDING  Incomplete  Blood Cultures x 2 sites     Status: None (Preliminary result)   Collection Time: 07/10/20 11:50 AM   Specimen: BLOOD LEFT FOREARM  Result Value Ref Range Status   Specimen Description BLOOD LEFT FOREARM  Final   Special Requests   Final    BOTTLES DRAWN AEROBIC AND ANAEROBIC Blood Culture results may not be optimal due to an excessive volume of blood received in culture bottles   Culture   Final    NO GROWTH 4 DAYS Performed at [facility], [address] [place], [place] [address]    Report Status PENDING  Incomplete  Respiratory Panel by RT PCR (Flu A&B, Covid) - Nasopharyngeal Swab     Status: None   Collection Time: 07/10/20 12:08 PM   Specimen: Nasopharyngeal Swab; Nasopharyngeal(NP) swabs in vial transport medium  Result Value Ref Range Status   SARS Coronavirus 2 by RT PCR NEGATIVE NEGATIVE Final    Comment: (NOTE) SARS-CoV-2 target nucleic acids are NOT DETECTED.  The SARS-CoV-2 RNA is generally detectable in upper respiratoy specimens during the acute phase of infection. The lowest concentration of SARS-CoV-2 viral copies this  assay can detect is 131 copies/mL. A negative result does not preclude SARS-Cov-2 infection and should not be used as the sole basis for treatment or other patient management decisions. A negative result may occur with  improper specimen collection/handling, submission of specimen other than nasopharyngeal swab, presence of viral mutation(s) within the areas targeted by this assay, and inadequate number of viral copies (<131 copies/mL). A negative result must be combined with clinical observations, patient history, and epidemiological information. The expected result is Negative.  Fact Sheet for Patients:  https://www.moore.com/  Fact Sheet for Healthcare Providers:  https://www.young.biz/  This test is no t yet approved or cleared by the [place] FDA and  has been authorized for detection and/or diagnosis of SARS-CoV-2 by FDA under an Emergency Use Authorization (EUA). This EUA will remain  in effect (meaning this test can be used) for the duration of the COVID-19 declaration under Section 564(b)(1) of the Act, 21 U.S.C. section 360bbb-3(b)(1), unless the authorization is terminated or revoked sooner.     Influenza A by PCR NEGATIVE NEGATIVE Final   Influenza B by PCR NEGATIVE NEGATIVE Final    Comment: (NOTE) The Xpert Xpress SARS-CoV-2/FLU/RSV assay is intended as an aid in  the diagnosis of influenza from Nasopharyngeal swab specimens and  should not be used as a sole basis for treatment. Nasal washings and  aspirates are unacceptable for Xpert Xpress SARS-CoV-2/FLU/RSV  testing.  Fact Sheet for Patients: https://www.moore.com/  Fact Sheet for Healthcare Providers: https://www.young.biz/  This test is not yet approved or cleared by the [place] FDA and  has been authorized for detection and/or diagnosis of SARS-CoV-2 by  FDA under an Emergency Use Authorization (EUA). This EUA will  remain  in effect (meaning this test can be used) for the duration of the  Covid-19 declaration under Section 564(b)(1) of the  Act, 21  U.S.C. section 360bbb-3(b)(1), unless the authorization is  terminated or revoked. Performed at [facility], [address] [place], [place] [address]   Blood Cultures x 2 sites     Status: None (Preliminary result)   Collection Time: 07/10/20  2:38 PM   Specimen: BLOOD RIGHT HAND  Result Value Ref Range Status   Specimen Description BLOOD RIGHT HAND  Final   Special Requests   Final    BOTTLES DRAWN AEROBIC AND ANAEROBIC Blood Culture results may not be optimal due to an inadequate volume of blood received in culture bottles   Culture   Final    NO GROWTH 4 DAYS Performed at [facility], [address] [place], [place] [address]    Report Status PENDING  Incomplete  Blood Cultures x 2 sites     Status: None (Preliminary result)   Collection Time: 07/10/20  2:43 PM   Specimen: BLOOD LEFT HAND  Result Value Ref Range Status   Specimen Description BLOOD LEFT HAND  Final   Special Requests   Final    BOTTLES DRAWN AEROBIC AND ANAEROBIC Blood Culture adequate volume   Culture   Final    NO GROWTH 4 DAYS Performed at [facility], [address] [place], [place] [address]    Report Status PENDING  Incomplete  Surgical pcr screen     Status: Abnormal   Collection Time: 07/11/20  5:00 PM   Specimen: Nasal Mucosa; Nasal Swab  Result Value Ref Range Status   MRSA, PCR NEGATIVE NEGATIVE Final   Staphylococcus aureus POSITIVE (A) NEGATIVE Final    Comment: (NOTE) The Xpert SA Assay (FDA approved for NASAL specimens in patients 72 years of age and older), is one component of a comprehensive surveillance program. It is not intended to diagnose infection nor to guide or monitor treatment. Performed at [facility], [address] [place], [place] [address]      Labs: BNP (last 3 results) No results for input(s): BNP  in the last 8760 hours. Basic Metabolic Panel: Recent Labs  Lab 07/10/20 1139 07/11/20 0441 07/13/20 0611 07/14/20 0102 07/15/20 0307  NA 134* 135 135 134* 133*  K 3.4* 3.5 3.8 3.7 3.5  CL 99 99 100 102 101  CO2 25 22 25  20* 23  GLUCOSE 154* 140* 155* 144* 188*  BUN 18 15 11 17 17   CREATININE 0.95 0.84 1.05 1.10 0.92  CALCIUM 9.2 9.1 9.1 8.5* 8.7*  MG  --  1.7 1.7  --   --    Liver Function Tests: Recent Labs  Lab 07/10/20 1139 07/13/20 0611  AST 22 31  ALT 22 31  ALKPHOS 81 75  BILITOT 0.7 0.5  PROT 7.1 6.2*  ALBUMIN 3.8 3.3*   No results for input(s): LIPASE, AMYLASE in the last 168 hours. No results for input(s): AMMONIA in the last 168 hours. CBC: Recent Labs  Lab 07/08/20 1737 07/08/20 1737 07/10/20 1139 07/10/20 1139 07/11/20 0441 07/12/20 0445 07/13/20 0611 07/14/20 0102 07/15/20 0307  WBC 6.6   < > 6.6   < > 6.6 6.8 8.2 7.7 6.4  NEUTROABS 4.5  --  5.0  --   --   --   --   --   --   HGB 14.5   < > 14.3   < > 13.9 14.6 15.0 14.1 13.8  HCT 43.0   < > 42.5   < > 41.0 43.8 44.8  42.0 42.2  MCV 88.5   < > 89.3   < > 88.0 90.1 90.3 90.3 90.4  PLT 226   < > 237   < > 204 215 214 182 186   < > = values in this interval not displayed.   Cardiac Enzymes: No results for input(s): CKTOTAL, CKMB, CKMBINDEX, TROPONINI in the last 168 hours. BNP: Invalid input(s): POCBNP CBG: Recent Labs  Lab 07/14/20 0652 07/14/20 1213 07/14/20 1552 07/14/20 2007 07/15/20 0642  GLUCAP 137* 163* 175* 126* 151*   D-Dimer No results for input(s): DDIMER in the last 72 hours. Hgb A1c No results for input(s): HGBA1C in the last 72 hours. Lipid Profile No results for input(s): CHOL, HDL, LDLCALC, TRIG, CHOLHDL, LDLDIRECT in the last 72 hours. Thyroid function studies No results for input(s): TSH, T4TOTAL, T3FREE, THYROIDAB in the last 72 hours.  Invalid input(s): FREET3 Anemia work up No results for input(s): VITAMINB12, FOLATE, FERRITIN, TIBC, IRON, RETICCTPCT in the  last 72 hours. Urinalysis No results found for: COLORURINE, APPEARANCEUR, LABSPEC, PHURINE, GLUCOSEU, HGBUR, BILIRUBINUR, KETONESUR, PROTEINUR, UROBILINOGEN, NITRITE, LEUKOCYTESUR Sepsis Labs Invalid input(s): PROCALCITONIN,  WBC,  LACTICIDVEN Microbiology Recent Results (from the past 240 hour(s))  Blood Cultures x 2 sites     Status: None (Preliminary result)   Collection Time: 07/10/20 11:40 AM   Specimen: BLOOD  Result Value Ref Range Status   Specimen Description BLOOD RIGHT ANTECUBITAL  Final   Special Requests   Final    BOTTLES DRAWN AEROBIC AND ANAEROBIC Blood Culture results may not be optimal due to an excessive volume of blood received in culture bottles   Culture   Final    NO GROWTH 4 DAYS Performed at [facility], [address] [place], [place] [address]    Report Status PENDING  Incomplete  Blood Cultures x 2 sites     Status: None (Preliminary result)   Collection Time: 07/10/20 11:50 AM   Specimen: BLOOD LEFT FOREARM  Result Value Ref Range Status   Specimen Description BLOOD LEFT FOREARM  Final   Special Requests   Final    BOTTLES DRAWN AEROBIC AND ANAEROBIC Blood Culture results may not be optimal due to an excessive volume of blood received in culture bottles   Culture   Final    NO GROWTH 4 DAYS Performed at [facility], [address] [place], [place] [address]    Report Status PENDING  Incomplete  Respiratory Panel by RT PCR (Flu A&B, Covid) - Nasopharyngeal Swab     Status: None   Collection Time: 07/10/20 12:08 PM   Specimen: Nasopharyngeal Swab; Nasopharyngeal(NP) swabs in vial transport medium  Result Value Ref Range Status   SARS Coronavirus 2 by RT PCR NEGATIVE NEGATIVE Final    Comment: (NOTE) SARS-CoV-2 target nucleic acids are NOT DETECTED.  The SARS-CoV-2 RNA is generally detectable in upper respiratoy specimens during the acute phase of infection. The lowest concentration of SARS-CoV-2 viral copies this assay can detect  is 131 copies/mL. A negative result does not preclude SARS-Cov-2 infection and should not be used as the sole basis for treatment or other patient management decisions. A negative result may occur with  improper specimen collection/handling, submission of specimen other than nasopharyngeal swab, presence of viral mutation(s) within the areas targeted by this assay, and inadequate number of viral copies (<131 copies/mL). A negative result must be combined with clinical observations, patient history, and epidemiological information. The expected result is Negative.  Fact Sheet  for Patients:  https://www.moore.com/  Fact Sheet for Healthcare Providers:  https://www.young.biz/  This test is no t yet approved or cleared by the [place] FDA and  has been authorized for detection and/or diagnosis of SARS-CoV-2 by FDA under an Emergency Use Authorization (EUA). This EUA will remain  in effect (meaning this test can be used) for the duration of the COVID-19 declaration under Section 564(b)(1) of the Act, 21 U.S.C. section 360bbb-3(b)(1), unless the authorization is terminated or revoked sooner.     Influenza A by PCR NEGATIVE NEGATIVE Final   Influenza B by PCR NEGATIVE NEGATIVE Final    Comment: (NOTE) The Xpert Xpress SARS-CoV-2/FLU/RSV assay is intended as an aid in  the diagnosis of influenza from Nasopharyngeal swab specimens and  should not be used as a sole basis for treatment. Nasal washings and  aspirates are unacceptable for Xpert Xpress SARS-CoV-2/FLU/RSV  testing.  Fact Sheet for Patients: https://www.moore.com/  Fact Sheet for Healthcare Providers: https://www.young.biz/  This test is not yet approved or cleared by the [place] FDA and  has been authorized for detection and/or diagnosis of SARS-CoV-2 by  FDA under an Emergency Use Authorization (EUA). This EUA will remain  in effect  (meaning this test can be used) for the duration of the  Covid-19 declaration under Section 564(b)(1) of the Act, 21  U.S.C. section 360bbb-3(b)(1), unless the authorization is  terminated or revoked. Performed at [facility], [address], [place], [place] [address]   Blood Cultures x 2 sites     Status: None (Preliminary result)   Collection Time: 07/10/20  2:38 PM   Specimen: BLOOD RIGHT HAND  Result Value Ref Range Status   Specimen Description BLOOD RIGHT HAND  Final   Special Requests   Final    BOTTLES DRAWN AEROBIC AND ANAEROBIC Blood Culture results may not be optimal due to an inadequate volume of blood received in culture bottles   Culture   Final    NO GROWTH 4 DAYS Performed at [facility], [address], [place], [place] [address]    Report Status PENDING  Incomplete  Blood Cultures x 2 sites     Status: None (Preliminary result)   Collection Time: 07/10/20  2:43 PM   Specimen: BLOOD LEFT HAND  Result Value Ref Range Status   Specimen Description BLOOD LEFT HAND  Final   Special Requests   Final    BOTTLES DRAWN AEROBIC AND ANAEROBIC Blood Culture adequate volume   Culture   Final    NO GROWTH 4 DAYS Performed at [facility], [address] [place], [place] [address]    Report Status PENDING  Incomplete  Surgical pcr screen     Status: Abnormal   Collection Time: 07/11/20  5:00 PM   Specimen: Nasal Mucosa; Nasal Swab  Result Value Ref Range Status   MRSA, PCR NEGATIVE NEGATIVE Final   Staphylococcus aureus POSITIVE (A) NEGATIVE Final    Comment: (NOTE) The Xpert SA Assay (FDA approved for NASAL specimens in patients 4 years of age and older), is one component of a comprehensive surveillance program. It is not intended to diagnose infection nor to guide or monitor treatment. Performed at [facility], [address], [place], [place] [address]      Time coordinating discharge: Over 30 minutes  SIGNED:   Marinda Elk, MD  [facility] 07/15/2020, 8:09 AM Pager   If 7PM-7AM, please contact night-coverage [email or web address] Password  [email or web address]

## 2020-07-17 ENCOUNTER — Encounter (HOSPITAL_COMMUNITY): Payer: Self-pay | Admitting: Emergency Medicine

## 2020-07-17 ENCOUNTER — Other Ambulatory Visit: Payer: Self-pay

## 2020-07-17 ENCOUNTER — Emergency Department (HOSPITAL_COMMUNITY)
Admission: EM | Admit: 2020-07-17 | Discharge: 2020-07-17 | Disposition: A | Discharge status: Home / Self Care | Payer: Medicare HMO | Attending: Emergency Medicine | Admitting: Emergency Medicine

## 2020-07-17 DIAGNOSIS — F0391 Unspecified dementia with behavioral disturbance: Secondary | ICD-10-CM | POA: Diagnosis not present

## 2020-07-17 DIAGNOSIS — Z79899 Other long term (current) drug therapy: Secondary | ICD-10-CM | POA: Diagnosis not present

## 2020-07-17 DIAGNOSIS — Z7901 Long term (current) use of anticoagulants: Secondary | ICD-10-CM | POA: Insufficient documentation

## 2020-07-17 DIAGNOSIS — I110 Hypertensive heart disease with heart failure: Secondary | ICD-10-CM | POA: Diagnosis not present

## 2020-07-17 DIAGNOSIS — E119 Type 2 diabetes mellitus without complications: Secondary | ICD-10-CM | POA: Insufficient documentation

## 2020-07-17 DIAGNOSIS — G8929 Other chronic pain: Secondary | ICD-10-CM | POA: Diagnosis not present

## 2020-07-17 DIAGNOSIS — Z7984 Long term (current) use of oral hypoglycemic drugs: Secondary | ICD-10-CM | POA: Insufficient documentation

## 2020-07-17 DIAGNOSIS — I4891 Unspecified atrial fibrillation: Secondary | ICD-10-CM | POA: Diagnosis not present

## 2020-07-17 DIAGNOSIS — I10 Essential (primary) hypertension: Secondary | ICD-10-CM | POA: Insufficient documentation

## 2020-07-17 DIAGNOSIS — R456 Violent behavior: Secondary | ICD-10-CM | POA: Diagnosis not present

## 2020-07-17 DIAGNOSIS — Z87891 Personal history of nicotine dependence: Secondary | ICD-10-CM | POA: Insufficient documentation

## 2020-07-17 DIAGNOSIS — R404 Transient alteration of awareness: Secondary | ICD-10-CM | POA: Diagnosis not present

## 2020-07-17 DIAGNOSIS — R419 Unspecified symptoms and signs involving cognitive functions and awareness: Secondary | ICD-10-CM | POA: Diagnosis not present

## 2020-07-17 DIAGNOSIS — I482 Chronic atrial fibrillation, unspecified: Secondary | ICD-10-CM | POA: Diagnosis not present

## 2020-07-17 DIAGNOSIS — E785 Hyperlipidemia, unspecified: Secondary | ICD-10-CM | POA: Diagnosis not present

## 2020-07-17 DIAGNOSIS — Z4781 Encounter for orthopedic aftercare following surgical amputation: Secondary | ICD-10-CM | POA: Diagnosis not present

## 2020-07-17 DIAGNOSIS — M25552 Pain in left hip: Secondary | ICD-10-CM | POA: Diagnosis not present

## 2020-07-17 DIAGNOSIS — I5030 Unspecified diastolic (congestive) heart failure: Secondary | ICD-10-CM | POA: Diagnosis not present

## 2020-07-17 DIAGNOSIS — R4182 Altered mental status, unspecified: Secondary | ICD-10-CM | POA: Diagnosis present

## 2020-07-17 LAB — CBC WITH DIFFERENTIAL/PLATELET
Abs Immature Granulocytes: 0.03 10*3/uL (ref 0.00–0.07)
Basophils Absolute: 0 10*3/uL (ref 0.0–0.1)
Basophils Relative: 0 %
Eosinophils Absolute: 0.2 10*3/uL (ref 0.0–0.5)
Eosinophils Relative: 2 %
HCT: 43.1 % (ref 39.0–52.0)
Hemoglobin: 14.5 g/dL (ref 13.0–17.0)
Immature Granulocytes: 0 %
Lymphocytes Relative: 20 %
Lymphs Abs: 2 10*3/uL (ref 0.7–4.0)
MCH: 30 pg (ref 26.0–34.0)
MCHC: 33.6 g/dL (ref 30.0–36.0)
MCV: 89.2 fL (ref 80.0–100.0)
Monocytes Absolute: 0.8 10*3/uL (ref 0.1–1.0)
Monocytes Relative: 8 %
Neutro Abs: 7 10*3/uL (ref 1.7–7.7)
Neutrophils Relative %: 70 %
Platelets: 236 10*3/uL (ref 150–400)
RBC: 4.83 MIL/uL (ref 4.22–5.81)
RDW: 12.2 % (ref 11.5–15.5)
WBC: 10 10*3/uL (ref 4.0–10.5)
nRBC: 0 % (ref 0.0–0.2)

## 2020-07-17 LAB — BASIC METABOLIC PANEL
Anion gap: 11 (ref 5–15)
BUN: 16 mg/dL (ref 8–23)
CO2: 21 mmol/L — ABNORMAL LOW (ref 22–32)
Calcium: 9.2 mg/dL (ref 8.9–10.3)
Chloride: 102 mmol/L (ref 98–111)
Creatinine, Ser: 0.8 mg/dL (ref 0.61–1.24)
GFR, Estimated: >60 mL/min (ref >60)
Glucose, Bld: 131 mg/dL — ABNORMAL HIGH (ref 70–99)
Potassium: 3.7 mmol/L (ref 3.5–5.1)
Sodium: 134 mmol/L — ABNORMAL LOW (ref 135–145)

## 2020-07-17 LAB — URINALYSIS, ROUTINE W REFLEX MICROSCOPIC
Bacteria, UA: NONE SEEN
Bilirubin Urine: NEGATIVE
Glucose, UA: NEGATIVE mg/dL
Ketones, ur: NEGATIVE mg/dL
Leukocytes,Ua: NEGATIVE
Nitrite: NEGATIVE
Protein, ur: 30 mg/dL — AB
Specific Gravity, Urine: 1.017 (ref 1.005–1.030)
pH: 6 (ref 5.0–8.0)

## 2020-07-17 MED ORDER — QUETIAPINE FUMARATE 25 MG PO TABS: 25.0000 mg | ORAL_TABLET | Freq: Every day | ORAL | 0 refills | Status: DC

## 2020-07-17 MED ORDER — QUETIAPINE FUMARATE 25 MG PO TABS
25.0000 mg | ORAL_TABLET | Freq: Once | ORAL | Status: AC
  Administered 2020-07-17: 25 mg via ORAL
  Filled 2020-07-17: qty 1

## 2020-07-17 NOTE — ED Notes (Signed)
Pt oriented to person, place and year, mildly agitated at circumstances. Does not answer questions regarding circumstances appropriately. R foot wrapped.,

## 2020-07-17 NOTE — ED Provider Notes (Signed)
[facility] EMERGENCY DEPARTMENT Provider Note  CSN: [id number] Arrival date & time: 07/17/20 1835    History Chief Complaint  Patient presents with  . Altered Mental Status    HPI  Manuel Turner is a 74 y.o. male with history of afib, DM and recent R great toe amputation for persistent osteomyelitis was discharged home 2 days ago. He is accompanied by his daughter at bedside now who reports he is living with his wife who has medical issues of her own and two other daughters who have been helping out since discharge. Per the daughter at bedside, who provided much of the history, the patient likely has some as yet undiagnosed dementia that has been covered up by his wife until recently. He did have some reported behavioral issues while in the hospital treated with Seroquel at night which seemed to help. Since he has been home, his behavioral problems have been more apparent, he has has been more confused, agitated, not sleeping, trying to leave the house. Daughter state she is unsure what medications he was prescribed at discharge but he does not think the patient has been taking any of them since discharge.    Past Medical History:  Diagnosis Date  . Atrial fibrillation (HCC)   . Diabetes mellitus without complication (HCC)   . Hyperlipemia   . Hypertension     Past Surgical History:  Procedure Laterality Date  . AMPUTATION Right 07/13/2020   Procedure: RIGHT GREAT TOE AMPUTATION;  Surgeon: Nadara Mustard, MD;  Location: [facility] OR;  Service: Orthopedics;  Laterality: Right;    Family History  Family history unknown: Yes    Social History   Tobacco Use  . Smoking status: Former Games developer  . Smokeless tobacco: Never Used  Substance Use Topics  . Alcohol use: No  . Drug use: Not on file     Home Medications Prior to Admission medications   Medication Sig Start Date End Date Taking? Authorizing Provider  amLODipine (NORVASC) 5 MG tablet Take 1 tablet (5 mg total) by mouth  daily. 07/15/20   Marinda Elk, MD  apixaban (ELIQUIS) 5 MG TABS tablet Take 1 tablet (5 mg total) by mouth 2 (two) times daily. 07/15/20   Marinda Elk, MD  atorvastatin (LIPITOR) 20 MG tablet Take 20 mg by mouth daily.    [provider]  carvedilol (COREG) 12.5 MG tablet Take 1 tablet (12.5 mg total) by mouth 2 (two) times daily. 12/24/19 07/10/20  Georgie Chard D, NP  diazepam (VALIUM) 5 MG tablet Take 2.5-5 mg by mouth as needed for anxiety.  12/28/16   [provider]  digoxin (LANOXIN) 0.125 MG tablet Take 2 tablets (0.25 mg total) by mouth daily. 03/06/18   Lyn Records, MD  losartan (COZAAR) 100 MG tablet Take 100 mg by mouth daily.    [provider]  metFORMIN (GLUCOPHAGE) 1000 MG tablet Take 1,000 mg by mouth daily with breakfast.     [provider]  QUEtiapine (SEROQUEL) 25 MG tablet Take 1 tablet (25 mg total) by mouth at bedtime. 07/17/20   Pollyann Savoy, MD  tiZANidine (ZANAFLEX) 4 MG tablet Take 4 mg by mouth every 8 (eight) hours as needed for muscle spasms.  10/13/18   [provider]     Allergies    Patient has no known allergies.   Review of Systems   Review of Systems Unable to assess due to mental status.    Physical Exam BP Marland Kitchen)  168/118 (BP Location: Left Arm)   Pulse 95   Temp 98.9 F (37.2 C) (Oral)   Resp 16   Ht 6\' 2"  (1.88 m)   Wt 75.8 kg   SpO2 97%   BMI 21.44 kg/m   Physical Exam Vitals and nursing note reviewed.  Constitutional:      Appearance: Normal appearance.  HENT:     Head: Normocephalic and atraumatic.     Nose: Nose normal.     Mouth/Throat:     Mouth: Mucous membranes are moist.  Eyes:     Extraocular Movements: Extraocular movements intact.     Conjunctiva/sclera: Conjunctivae normal.  Cardiovascular:     Rate and Rhythm: Normal rate.  Pulmonary:     Effort: Pulmonary effort is normal.     Breath sounds: Normal breath sounds.  Abdominal:     General: Abdomen  is flat.     Palpations: Abdomen is soft.     Tenderness: There is no abdominal tenderness.  Musculoskeletal:        General: No swelling. Normal range of motion.     Cervical back: Neck supple.     Comments: Post-op changes of R foot, incision is clean and dry, no drainage or signs of infection.   Skin:    General: Skin is warm and dry.  Neurological:     General: No focal deficit present.     Mental Status: He is alert. He is disoriented.     Cranial Nerves: No cranial nerve deficit.     Sensory: No sensory deficit.     Motor: No weakness.  Psychiatric:        Mood and Affect: Mood normal.      ED Results / Procedures / Treatments   Labs (all labs ordered are listed, but only abnormal results are displayed) Labs Reviewed  BASIC METABOLIC PANEL - Abnormal; Notable for the following components:      Result Value   Sodium 134 (*)    CO2 21 (*)    Glucose, Bld 131 (*)    All other components within normal limits  URINALYSIS, ROUTINE W REFLEX MICROSCOPIC - Abnormal; Notable for the following components:   Hgb urine dipstick SMALL (*)    Protein, ur 30 (*)    All other components within normal limits  CBC WITH DIFFERENTIAL/PLATELET    EKG None  Radiology No results found.  Procedures Procedures  Medications Ordered in the ED Medications  QUEtiapine (SEROQUEL) tablet 25 mg (has no administration in time range)     MDM Rules/Calculators/A&P MDM Patient with worsening behavior, likely due to dementia but will check labs to ensure no concerning changes since discharge. Will discuss ultimate disposition with family if no concerning findings.  ED Course  I have reviewed the triage vital signs and the nursing notes.  Pertinent labs & imaging results that were available during my care of the patient were reviewed by me and considered in my medical decision making (see chart for details).  Clinical Course as of Jul 17 2245  08-22-1971 Jul 17, 2020  2143 UA negative.     [CS]  2201 CBC normal.    [CS]  2228 BMP unremarkable.    [CS]  2233 Patient trying to get up out of bed, no specific reason and no complaints. Daughter is attentive and redirecting him back to bed. I discussed with her that labs do not indicate a reason to be readmitted. Discussed options including overnight ED observation for SW/CM evaluation  in the AM however she would before to take him home and begin the placement process with PCP. He seemed to respond well to Seroquel while in the hospital so will give a dose of that here and a short term Rx to take at home for the next few days until his living situation can be figured out.    [CS]    Clinical Course User Index [CS] Pollyann Savoy, MD    Final Clinical Impression(s) / ED Diagnoses Final diagnoses:  Dementia with behavioral disturbance, unspecified dementia type [facility])    Rx / DC Orders ED Discharge Orders         Ordered    QUEtiapine (SEROQUEL) 25 MG tablet  Daily at bedtime        07/17/20 2245           Pollyann Savoy, MD 07/17/20 2246

## 2020-07-17 NOTE — ED Notes (Signed)
Daughter is req social work consult. Dianna [phone]

## 2020-07-17 NOTE — ED Triage Notes (Signed)
Pt BIB [facility] from home. Complaint of worsening dementia per family. Per family patient becoming increasing combativeness. Pt had toe amputated Wednesday. Family also worried about that.

## 2020-07-18 ENCOUNTER — Telehealth: Payer: Self-pay | Admitting: Surgery

## 2020-07-18 ENCOUNTER — Telehealth: Payer: Self-pay | Admitting: Orthopedic Surgery

## 2020-07-18 NOTE — Telephone Encounter (Signed)
ED CM received consult via CM Consult Pool no orders noted. But CM noted that patient was set up with [facility] services from hospital discharged 11/26.  No ED CM needs identified.

## 2020-07-18 NOTE — Telephone Encounter (Signed)
I called and gave verbal ok for orders below. I also called pt to see if we can make an appt for first post op appt. I spoke with daughter who states that they are not doing well at home trying to take care of the pt. He is combative and full weight bearing on his surgical foot. They are unable to get pt to remain non wtb and are trying to get pt in a long term care facility because they are just not equipped to handle the pt's needs. Advised to monitor the incision the best the can to keep clean and dry and call if there is anything that I can do. Advised they do not know how to get pt in the office for evaluation but once in a facility will be able to have pt make follow up visit.

## 2020-07-18 NOTE — Telephone Encounter (Signed)
Verbal orders for pt  2 a week for 2 weeks  1 time a week for 7 weeks Also requesting orders for a social worker eval for community resources and long term care planning   Also notifying they received com from family they had to take to the ER due to altered mental status   [phone] -- Byrd Hesselbach with [facility]

## 2020-07-19 ENCOUNTER — Telehealth: Payer: Self-pay | Admitting: Orthopedic Surgery

## 2020-07-19 ENCOUNTER — Ambulatory Visit: Payer: Medicare HMO | Admitting: Interventional Cardiology

## 2020-07-19 ENCOUNTER — Other Ambulatory Visit: Payer: Self-pay | Admitting: Physician Assistant

## 2020-07-19 MED ORDER — APIXABAN 5 MG PO TABS
5.0000 mg | ORAL_TABLET | Freq: Two times a day (BID) | ORAL | 0 refills | Status: DC
Start: 2020-07-19 — End: 2020-11-01

## 2020-07-19 MED ORDER — AMLODIPINE BESYLATE 5 MG PO TABS
5.0000 mg | ORAL_TABLET | Freq: Every day | ORAL | 0 refills | Status: DC
Start: 2020-07-19 — End: 2020-09-14

## 2020-07-19 NOTE — Telephone Encounter (Signed)
Pts daughter rebecca called stating the pt was supposed to have amlodipine and apixaban called into the [facility] pharmacy on [address] but it never was sent so the pt would like to have this done and would like Lurena Joiner notified when its sent in   [phone]

## 2020-07-19 NOTE — Telephone Encounter (Signed)
This pt was a right GT amp 07/13/20 who would have d/c pt from the hospital with these meds? Should his PCP do this or can we do a one time refill until he gets to PCP?

## 2020-07-19 NOTE — Telephone Encounter (Signed)
Gave 1 refill for each but will need to get these meds through PCP after this

## 2020-07-19 NOTE — Telephone Encounter (Signed)
I called and sw pt's daughter to advise of message below. Will clal with any other questions.

## 2020-07-20 ENCOUNTER — Other Ambulatory Visit: Payer: Self-pay

## 2020-07-20 ENCOUNTER — Encounter: Payer: Self-pay | Admitting: Family

## 2020-07-20 ENCOUNTER — Ambulatory Visit (INDEPENDENT_AMBULATORY_CARE_PROVIDER_SITE_OTHER): Payer: Medicare HMO | Admitting: Family

## 2020-07-20 VITALS — Ht 74.0 in | Wt 167.0 lb

## 2020-07-20 DIAGNOSIS — Z89412 Acquired absence of left great toe: Secondary | ICD-10-CM

## 2020-07-20 DIAGNOSIS — S98111A Complete traumatic amputation of right great toe, initial encounter: Secondary | ICD-10-CM
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
## 2020-07-22 DIAGNOSIS — I110 Hypertensive heart disease with heart failure: Secondary | ICD-10-CM | POA: Diagnosis not present

## 2020-07-22 DIAGNOSIS — R419 Unspecified symptoms and signs involving cognitive functions and awareness: Secondary | ICD-10-CM | POA: Diagnosis not present

## 2020-07-22 DIAGNOSIS — E119 Type 2 diabetes mellitus without complications: Secondary | ICD-10-CM | POA: Diagnosis not present

## 2020-07-22 DIAGNOSIS — E785 Hyperlipidemia, unspecified: Secondary | ICD-10-CM | POA: Diagnosis not present

## 2020-07-22 DIAGNOSIS — G8929 Other chronic pain: Secondary | ICD-10-CM | POA: Diagnosis not present

## 2020-07-22 DIAGNOSIS — Z4781 Encounter for orthopedic aftercare following surgical amputation: Secondary | ICD-10-CM | POA: Diagnosis not present

## 2020-07-22 DIAGNOSIS — M25552 Pain in left hip: Secondary | ICD-10-CM | POA: Diagnosis not present

## 2020-07-22 DIAGNOSIS — I482 Chronic atrial fibrillation, unspecified: Secondary | ICD-10-CM | POA: Diagnosis not present

## 2020-07-22 DIAGNOSIS — I5030 Unspecified diastolic (congestive) heart failure: Secondary | ICD-10-CM | POA: Diagnosis not present

## 2020-07-26 DIAGNOSIS — G8929 Other chronic pain: Secondary | ICD-10-CM | POA: Diagnosis not present

## 2020-07-26 DIAGNOSIS — I110 Hypertensive heart disease with heart failure: Secondary | ICD-10-CM | POA: Diagnosis not present

## 2020-07-26 DIAGNOSIS — M25552 Pain in left hip: Secondary | ICD-10-CM | POA: Diagnosis not present

## 2020-07-26 DIAGNOSIS — E119 Type 2 diabetes mellitus without complications: Secondary | ICD-10-CM | POA: Diagnosis not present

## 2020-07-26 DIAGNOSIS — E785 Hyperlipidemia, unspecified: Secondary | ICD-10-CM | POA: Diagnosis not present

## 2020-07-26 DIAGNOSIS — R419 Unspecified symptoms and signs involving cognitive functions and awareness: Secondary | ICD-10-CM | POA: Diagnosis not present

## 2020-07-26 DIAGNOSIS — I5030 Unspecified diastolic (congestive) heart failure: Secondary | ICD-10-CM | POA: Diagnosis not present

## 2020-07-26 DIAGNOSIS — I482 Chronic atrial fibrillation, unspecified: Secondary | ICD-10-CM | POA: Diagnosis not present

## 2020-07-26 DIAGNOSIS — Z4781 Encounter for orthopedic aftercare following surgical amputation: Secondary | ICD-10-CM | POA: Diagnosis not present

## 2020-07-27 ENCOUNTER — Encounter: Payer: Self-pay | Admitting: Family

## 2020-07-27 ENCOUNTER — Ambulatory Visit (INDEPENDENT_AMBULATORY_CARE_PROVIDER_SITE_OTHER): Payer: Medicare HMO | Admitting: Family

## 2020-07-27 DIAGNOSIS — Z89411 Acquired absence of right great toe: Secondary | ICD-10-CM

## 2020-07-27 DIAGNOSIS — S98111A Complete traumatic amputation of right great toe, initial encounter: Secondary | ICD-10-CM

## 2020-07-27 NOTE — Progress Notes (Signed)
   Post-Op Visit Note   Patient: Manuel Turner           Date of Birth: 06/16/1946           MRN: [id number] Visit Date: 07/27/2020 PCP: Jarrett Soho, PA-C  Chief Complaint: No chief complaint on file.   HPI:  HPI The patient is a 74 year old gentleman seen status post right great toe amputation.  Ortho Exam On examination of right foot sutures are in place. Incision is healing well. Significant surrounding maceration. No drainage. No erythema or cellulitis.   Visit Diagnoses: No diagnosis found.  Plan: Sutures harvested today without incident.  They will continue daily dose of cleansing dry dressing changes he will follow-up in the office in 2 more weeks discussed return precautions for signs of infection with the daughter  Follow-Up Instructions: No follow-ups on file.   Imaging: No results found.  Orders:  No orders of the defined types were placed in this encounter.  No orders of the defined types were placed in this encounter.    PMFS History: Patient Active Problem List   Diagnosis Date Noted  . Osteomyelitis of right foot (HCC)   . Osteomyelitis of toe (HCC) 07/10/2020  . Hypertensive urgency 07/10/2020  . Hypokalemia 07/10/2020  . Hyponatremia 07/10/2020  . Type 2 diabetes mellitus with foot ulcer (HCC) 07/10/2020  . Encounter for therapeutic drug monitoring 12/17/2019  . (HFpEF) heart failure with preserved ejection fraction (HCC) 12/07/2014  . Atrial fibrillation, chronic (HCC) 03/16/2014  . Essential hypertension 03/16/2014  . Chronic anticoagulation 03/16/2014  . Bilateral lower extremity edema 03/16/2014   Past Medical History:  Diagnosis Date  . Atrial fibrillation (HCC)   . Diabetes mellitus without complication (HCC)   . Hyperlipemia   . Hypertension     Family History  Family history unknown: Yes    Past Surgical History:  Procedure Laterality Date  . AMPUTATION Right 07/13/2020   Procedure: RIGHT GREAT TOE AMPUTATION;  Surgeon:  Nadara Mustard, MD;  Location: [facility] OR;  Service: Orthopedics;  Laterality: Right;   Social History   Occupational History  . Not on file  Tobacco Use  . Smoking status: Former Games developer  . Smokeless tobacco: Never Used  Substance and Sexual Activity  . Alcohol use: No  . Drug use: Not on file  . Sexual activity: Not on file

## 2020-07-28 DIAGNOSIS — I482 Chronic atrial fibrillation, unspecified: Secondary | ICD-10-CM | POA: Diagnosis not present

## 2020-07-28 DIAGNOSIS — G8929 Other chronic pain: Secondary | ICD-10-CM | POA: Diagnosis not present

## 2020-07-28 DIAGNOSIS — I5030 Unspecified diastolic (congestive) heart failure: Secondary | ICD-10-CM | POA: Diagnosis not present

## 2020-07-28 DIAGNOSIS — R419 Unspecified symptoms and signs involving cognitive functions and awareness: Secondary | ICD-10-CM | POA: Diagnosis not present

## 2020-07-28 DIAGNOSIS — Z4781 Encounter for orthopedic aftercare following surgical amputation: Secondary | ICD-10-CM | POA: Diagnosis not present

## 2020-07-28 DIAGNOSIS — I110 Hypertensive heart disease with heart failure: Secondary | ICD-10-CM | POA: Diagnosis not present

## 2020-07-28 DIAGNOSIS — E119 Type 2 diabetes mellitus without complications: Secondary | ICD-10-CM | POA: Diagnosis not present

## 2020-07-28 DIAGNOSIS — M25552 Pain in left hip: Secondary | ICD-10-CM | POA: Diagnosis not present

## 2020-07-28 DIAGNOSIS — E785 Hyperlipidemia, unspecified: Secondary | ICD-10-CM | POA: Diagnosis not present

## 2020-08-03 DIAGNOSIS — R4182 Altered mental status, unspecified: Secondary | ICD-10-CM | POA: Diagnosis not present

## 2020-08-03 DIAGNOSIS — L97511 Non-pressure chronic ulcer of other part of right foot limited to breakdown of skin: Secondary | ICD-10-CM | POA: Diagnosis not present

## 2020-08-03 DIAGNOSIS — Z7901 Long term (current) use of anticoagulants: Secondary | ICD-10-CM | POA: Diagnosis not present

## 2020-08-03 DIAGNOSIS — E785 Hyperlipidemia, unspecified: Secondary | ICD-10-CM | POA: Diagnosis not present

## 2020-08-03 DIAGNOSIS — I10 Essential (primary) hypertension: Secondary | ICD-10-CM | POA: Diagnosis not present

## 2020-08-03 DIAGNOSIS — E119 Type 2 diabetes mellitus without complications: Secondary | ICD-10-CM | POA: Diagnosis not present

## 2020-08-03 DIAGNOSIS — I4891 Unspecified atrial fibrillation: Secondary | ICD-10-CM | POA: Diagnosis not present

## 2020-08-04 DIAGNOSIS — M25552 Pain in left hip: Secondary | ICD-10-CM | POA: Diagnosis not present

## 2020-08-04 DIAGNOSIS — I5030 Unspecified diastolic (congestive) heart failure: Secondary | ICD-10-CM | POA: Diagnosis not present

## 2020-08-04 DIAGNOSIS — E785 Hyperlipidemia, unspecified: Secondary | ICD-10-CM | POA: Diagnosis not present

## 2020-08-04 DIAGNOSIS — R419 Unspecified symptoms and signs involving cognitive functions and awareness: Secondary | ICD-10-CM | POA: Diagnosis not present

## 2020-08-04 DIAGNOSIS — I110 Hypertensive heart disease with heart failure: Secondary | ICD-10-CM | POA: Diagnosis not present

## 2020-08-04 DIAGNOSIS — G8929 Other chronic pain: Secondary | ICD-10-CM | POA: Diagnosis not present

## 2020-08-04 DIAGNOSIS — I482 Chronic atrial fibrillation, unspecified: Secondary | ICD-10-CM | POA: Diagnosis not present

## 2020-08-04 DIAGNOSIS — E119 Type 2 diabetes mellitus without complications: Secondary | ICD-10-CM | POA: Diagnosis not present

## 2020-08-04 DIAGNOSIS — Z4781 Encounter for orthopedic aftercare following surgical amputation: Secondary | ICD-10-CM | POA: Diagnosis not present

## 2020-08-09 ENCOUNTER — Ambulatory Visit (INDEPENDENT_AMBULATORY_CARE_PROVIDER_SITE_OTHER): Payer: Medicare HMO | Admitting: Physician Assistant

## 2020-08-09 DIAGNOSIS — Z89411 Acquired absence of right great toe: Secondary | ICD-10-CM | POA: Diagnosis not present

## 2020-08-09 DIAGNOSIS — S98111A Complete traumatic amputation of right great toe, initial encounter: Secondary | ICD-10-CM

## 2020-08-09 NOTE — Progress Notes (Signed)
Office Visit Note   Patient: Manuel Turner           Date of Birth: 09/30/1945           MRN: [id number] Visit Date: 08/09/2020              Requested by: Jarrett Soho, PA-C [address] [place],  [place] [address] PCP: Jarrett Soho, PA-C  No chief complaint on file.     HPI: Patient presents today 4 weeks status post right great toe amputation. He is accompanied by his daughter. His sutures were removed at his last visit. His daughter states that he has developed a ulcer on the bottom of the great toe joint. She discussed this with her primary care with his primary care provider who recommended wound care.  Assessment & Plan: Visit Diagnoses: No diagnosis found.  Plan: Patient was seen and evaluated and treated today directly by Dr. Lajoyce Corners. He debrided the ulcer on the bottom of the foot as well as the area over the incision. This was debrided to healthy surfaces. Hemostasis was achieved with a silver nitrate stick. Patient will do daily dry dressing changes. Will go into a postop shoe with felt relieving pad over this area. Have encouraged the patient to do Achilles stretching with his daughter. Discussed with her compression stockings as well as a new balance shoe. follow-up in 2 weeks  Follow-Up Instructions: No follow-ups on file.   Ortho Exam  Patient is alert, oriented, no adenopathy, well-dressed, normal affect, normal respiratory effort. Overall incision is healed however he does have significant callusing which was debrided. He has a 3 x 3 cm grade 1 Wagner ulcer beneath the 1st metatarsal head. Has surrounding significant callus this was all debrided to healthy skin. Patient placed in a felt relieving padded postop shoe. Instructed in how to stretch Achilles. There was no sign of cellulitis or acute infection overall swelling well controlled he has a Achilles contracture making it difficult for him to even come to a neutral position with affiliated forefoot  overload  Imaging: No results found. No images are attached to the encounter.  Labs: Lab Results  Component Value Date   HGBA1C 7.0 (H) 07/10/2020   ESRSEDRATE 12 07/10/2020   ESRSEDRATE 22 (H) 07/08/2020   CRP 0.5 07/10/2020   REPTSTATUS 07/15/2020 FINAL 07/10/2020   CULT  07/10/2020    NO GROWTH 5 DAYS Performed at [facility], [address], [place], [place] [address]      Lab Results  Component Value Date   ALBUMIN 3.3 (L) 07/13/2020   ALBUMIN 3.8 07/10/2020   PREALBUMIN 15.6 (L) 07/10/2020    Lab Results  Component Value Date   MG 1.7 07/13/2020   MG 1.7 07/11/2020   No results found for: [facility]  Lab Results  Component Value Date   PREALBUMIN 15.6 (L) 07/10/2020   CBC EXTENDED Latest Ref Rng & Units 07/17/2020 07/15/2020 07/14/2020  WBC 4.0 - 10.5 K/uL 10.0 6.4 7.7  RBC 4.22 - 5.81 MIL/uL 4.83 4.67 4.65  HGB 13.0 - 17.0 g/dL 76.1 95.0 93.2  HCT 67.1 - 52.0 % 43.1 42.2 42.0  PLT 150 - 400 K/uL 236 186 182  NEUTROABS 1.7 - 7.7 K/uL 7.0 - -  LYMPHSABS 0.7 - 4.0 K/uL 2.0 - -     There is no height or weight on file to calculate BMI.  Orders:  No orders of the defined types were placed in this encounter.  No  orders of the defined types were placed in this encounter.    Procedures: No procedures performed  Clinical Data: No additional findings.  ROS:  All other systems negative, except as noted in the HPI. Review of Systems  Objective: Vital Signs: There were no vitals taken for this visit.  Specialty Comments:  No specialty comments available.  PMFS History: Patient Active Problem List   Diagnosis Date Noted  . Amputated great toe, right (HCC) 07/27/2020  . Osteomyelitis of right foot (HCC)   . Osteomyelitis of toe (HCC) 07/10/2020  . Hypertensive urgency 07/10/2020  . Hypokalemia 07/10/2020  . Hyponatremia 07/10/2020  . Type 2 diabetes mellitus with foot ulcer (HCC) 07/10/2020  . Encounter for therapeutic drug monitoring  12/17/2019  . (HFpEF) heart failure with preserved ejection fraction (HCC) 12/07/2014  . Atrial fibrillation, chronic (HCC) 03/16/2014  . Essential hypertension 03/16/2014  . Chronic anticoagulation 03/16/2014  . Bilateral lower extremity edema 03/16/2014   Past Medical History:  Diagnosis Date  . Atrial fibrillation (HCC)   . Diabetes mellitus without complication (HCC)   . Hyperlipemia   . Hypertension     Family History  Family history unknown: Yes    Past Surgical History:  Procedure Laterality Date  . AMPUTATION Right 07/13/2020   Procedure: RIGHT GREAT TOE AMPUTATION;  Surgeon: Nadara Mustard, MD;  Location: [facility] OR;  Service: Orthopedics;  Laterality: Right;   Social History   Occupational History  . Not on file  Tobacco Use  . Smoking status: Former Games developer  . Smokeless tobacco: Never Used  Substance and Sexual Activity  . Alcohol use: No  . Drug use: Not on file  . Sexual activity: Not on file

## 2020-08-10 ENCOUNTER — Ambulatory Visit: Payer: Medicare HMO | Admitting: Podiatry

## 2020-08-16 DIAGNOSIS — E119 Type 2 diabetes mellitus without complications: Secondary | ICD-10-CM | POA: Diagnosis not present

## 2020-08-16 DIAGNOSIS — G8929 Other chronic pain: Secondary | ICD-10-CM | POA: Diagnosis not present

## 2020-08-16 DIAGNOSIS — I482 Chronic atrial fibrillation, unspecified: Secondary | ICD-10-CM | POA: Diagnosis not present

## 2020-08-16 DIAGNOSIS — E785 Hyperlipidemia, unspecified: Secondary | ICD-10-CM | POA: Diagnosis not present

## 2020-08-16 DIAGNOSIS — I110 Hypertensive heart disease with heart failure: Secondary | ICD-10-CM | POA: Diagnosis not present

## 2020-08-16 DIAGNOSIS — Z4781 Encounter for orthopedic aftercare following surgical amputation: Secondary | ICD-10-CM | POA: Diagnosis not present

## 2020-08-16 DIAGNOSIS — R419 Unspecified symptoms and signs involving cognitive functions and awareness: Secondary | ICD-10-CM | POA: Diagnosis not present

## 2020-08-16 DIAGNOSIS — I5030 Unspecified diastolic (congestive) heart failure: Secondary | ICD-10-CM | POA: Diagnosis not present

## 2020-08-16 DIAGNOSIS — M25552 Pain in left hip: Secondary | ICD-10-CM | POA: Diagnosis not present

## 2020-08-22 DIAGNOSIS — E119 Type 2 diabetes mellitus without complications: Secondary | ICD-10-CM | POA: Diagnosis not present

## 2020-08-22 DIAGNOSIS — Z4781 Encounter for orthopedic aftercare following surgical amputation: Secondary | ICD-10-CM | POA: Diagnosis not present

## 2020-08-22 DIAGNOSIS — I482 Chronic atrial fibrillation, unspecified: Secondary | ICD-10-CM | POA: Diagnosis not present

## 2020-08-22 DIAGNOSIS — E785 Hyperlipidemia, unspecified: Secondary | ICD-10-CM | POA: Diagnosis not present

## 2020-08-22 DIAGNOSIS — G8929 Other chronic pain: Secondary | ICD-10-CM | POA: Diagnosis not present

## 2020-08-22 DIAGNOSIS — M25552 Pain in left hip: Secondary | ICD-10-CM | POA: Diagnosis not present

## 2020-08-22 DIAGNOSIS — I5030 Unspecified diastolic (congestive) heart failure: Secondary | ICD-10-CM | POA: Diagnosis not present

## 2020-08-22 DIAGNOSIS — R419 Unspecified symptoms and signs involving cognitive functions and awareness: Secondary | ICD-10-CM | POA: Diagnosis not present

## 2020-08-22 DIAGNOSIS — I110 Hypertensive heart disease with heart failure: Secondary | ICD-10-CM | POA: Diagnosis not present

## 2020-08-25 ENCOUNTER — Other Ambulatory Visit: Payer: Self-pay

## 2020-08-25 ENCOUNTER — Encounter: Payer: Self-pay | Admitting: Orthopedic Surgery

## 2020-08-25 ENCOUNTER — Ambulatory Visit (INDEPENDENT_AMBULATORY_CARE_PROVIDER_SITE_OTHER): Payer: Medicare HMO | Admitting: Physician Assistant

## 2020-08-25 DIAGNOSIS — Z89411 Acquired absence of right great toe: Secondary | ICD-10-CM

## 2020-08-25 DIAGNOSIS — S98111A Complete traumatic amputation of right great toe, initial encounter: Secondary | ICD-10-CM

## 2020-08-25 NOTE — Progress Notes (Signed)
Office Visit Note   Patient: Manuel Turner           Date of Birth: [birth year]           MRN: [id number] Visit Date: 08/25/2020              Requested by: Jarrett Soho, PA-C [address] [place],  [place] [address] PCP: Jarrett Soho, PA-C  Chief Complaint  Patient presents with  . Right Foot - Wound Check      HPI: Patient presents in follow-up for his right foot.  He is 6 weeks status post right great toe amputation.  At his last visit his incision site as well as a thick callused area beneath his first MTP joint were debrided.  He has been wearing a postop shoe with pads in it to offload pressure.  He has been working on Achilles stretching.  His daughter brought the orthotics that were recommended specifically sole  Assessment & Plan: Visit Diagnoses: No diagnosis found.  Plan: Felt relieving pad was placed beneath the orthotic.  Should continue Achilles stretching.  Should look at feet daily for any areas of pressure and follow-up in 3 weeks  Follow-Up Instructions: No follow-ups on file.   Ortho Exam  Patient is alert, oriented, no adenopathy, well-dressed, normal affect, normal respiratory effort. Focused examination of his right foot demonstrates well-healed surgical incision.  Rest of callus was debrided over the incision was exposed to healthy skin.  Underneath the metatarsal there is a thin callus as well that was debrided to healthy skin.  There is no cellulitis no foul odor still only comes to neutral with dorsiflexion swelling is minimal no evidence of acute infection  Imaging: No results found. No images are attached to the encounter.  Labs: Lab Results  Component Value Date   HGBA1C 7.0 (H) 07/10/2020   ESRSEDRATE 12 07/10/2020   ESRSEDRATE 22 (H) 07/08/2020   CRP 0.5 07/10/2020   REPTSTATUS 07/15/2020 FINAL 07/10/2020   CULT  07/10/2020    NO GROWTH 5 DAYS Performed at [facility], [address] [place], [place] [address]       Lab Results  Component Value Date   ALBUMIN 3.3 (L) 07/13/2020   ALBUMIN 3.8 07/10/2020   PREALBUMIN 15.6 (L) 07/10/2020    Lab Results  Component Value Date   MG 1.7 07/13/2020   MG 1.7 07/11/2020   No results found for: [facility]  Lab Results  Component Value Date   PREALBUMIN 15.6 (L) 07/10/2020   CBC EXTENDED Latest Ref Rng & Units 07/17/2020 07/15/2020 07/14/2020  WBC 4.0 - 10.5 K/uL 10.0 6.4 7.7  RBC 4.22 - 5.81 MIL/uL 4.83 4.67 4.65  HGB 13.0 - 17.0 g/dL 89.2 11.9 41.7  HCT 40.8 - 52.0 % 43.1 42.2 42.0  PLT 150 - 400 K/uL 236 186 182  NEUTROABS 1.7 - 7.7 K/uL 7.0 - -  LYMPHSABS 0.7 - 4.0 K/uL 2.0 - -     There is no height or weight on file to calculate BMI.  Orders:  No orders of the defined types were placed in this encounter.  No orders of the defined types were placed in this encounter.    Procedures: No procedures performed  Clinical Data: No additional findings.  ROS:  All other systems negative, except as noted in the HPI. Review of Systems  Objective: Vital Signs: There were no vitals taken for this visit.  Specialty Comments:  No specialty comments available.  PMFS  History: Patient Active Problem List   Diagnosis Date Noted  . Amputated great toe, right (HCC) 07/27/2020  . Osteomyelitis of right foot (HCC)   . Osteomyelitis of toe (HCC) 07/10/2020  . Hypertensive urgency 07/10/2020  . Hypokalemia 07/10/2020  . Hyponatremia 07/10/2020  . Type 2 diabetes mellitus with foot ulcer (HCC) 07/10/2020  . Encounter for therapeutic drug monitoring 12/17/2019  . (HFpEF) heart failure with preserved ejection fraction (HCC) 12/07/2014  . Atrial fibrillation, chronic (HCC) 03/16/2014  . Essential hypertension 03/16/2014  . Chronic anticoagulation 03/16/2014  . Bilateral lower extremity edema 03/16/2014   Past Medical History:  Diagnosis Date  . Atrial fibrillation (HCC)   . Diabetes mellitus without complication (HCC)   . Hyperlipemia    . Hypertension     Family History  Family history unknown: Yes    Past Surgical History:  Procedure Laterality Date  . AMPUTATION Right 07/13/2020   Procedure: RIGHT GREAT TOE AMPUTATION;  Surgeon: Nadara Mustard, MD;  Location: [facility] OR;  Service: Orthopedics;  Laterality: Right;   Social History   Occupational History  . Not on file  Tobacco Use  . Smoking status: Former Games developer  . Smokeless tobacco: Never Used  Substance and Sexual Activity  . Alcohol use: No  . Drug use: Not on file  . Sexual activity: Not on file

## 2020-08-30 DIAGNOSIS — R419 Unspecified symptoms and signs involving cognitive functions and awareness: Secondary | ICD-10-CM | POA: Diagnosis not present

## 2020-08-30 DIAGNOSIS — I110 Hypertensive heart disease with heart failure: Secondary | ICD-10-CM | POA: Diagnosis not present

## 2020-08-30 DIAGNOSIS — G8929 Other chronic pain: Secondary | ICD-10-CM | POA: Diagnosis not present

## 2020-08-30 DIAGNOSIS — I5030 Unspecified diastolic (congestive) heart failure: Secondary | ICD-10-CM | POA: Diagnosis not present

## 2020-08-30 DIAGNOSIS — M25552 Pain in left hip: Secondary | ICD-10-CM | POA: Diagnosis not present

## 2020-08-30 DIAGNOSIS — E785 Hyperlipidemia, unspecified: Secondary | ICD-10-CM | POA: Diagnosis not present

## 2020-08-30 DIAGNOSIS — Z4781 Encounter for orthopedic aftercare following surgical amputation: Secondary | ICD-10-CM | POA: Diagnosis not present

## 2020-08-30 DIAGNOSIS — E119 Type 2 diabetes mellitus without complications: Secondary | ICD-10-CM | POA: Diagnosis not present

## 2020-08-30 DIAGNOSIS — I482 Chronic atrial fibrillation, unspecified: Secondary | ICD-10-CM | POA: Diagnosis not present

## 2020-09-06 DIAGNOSIS — M25552 Pain in left hip: Secondary | ICD-10-CM | POA: Diagnosis not present

## 2020-09-06 DIAGNOSIS — E785 Hyperlipidemia, unspecified: Secondary | ICD-10-CM | POA: Diagnosis not present

## 2020-09-06 DIAGNOSIS — I482 Chronic atrial fibrillation, unspecified: Secondary | ICD-10-CM | POA: Diagnosis not present

## 2020-09-06 DIAGNOSIS — G8929 Other chronic pain: Secondary | ICD-10-CM | POA: Diagnosis not present

## 2020-09-06 DIAGNOSIS — R419 Unspecified symptoms and signs involving cognitive functions and awareness: Secondary | ICD-10-CM | POA: Diagnosis not present

## 2020-09-06 DIAGNOSIS — E119 Type 2 diabetes mellitus without complications: Secondary | ICD-10-CM | POA: Diagnosis not present

## 2020-09-06 DIAGNOSIS — I5030 Unspecified diastolic (congestive) heart failure: Secondary | ICD-10-CM | POA: Diagnosis not present

## 2020-09-06 DIAGNOSIS — I110 Hypertensive heart disease with heart failure: Secondary | ICD-10-CM | POA: Diagnosis not present

## 2020-09-06 DIAGNOSIS — Z4781 Encounter for orthopedic aftercare following surgical amputation: Secondary | ICD-10-CM | POA: Diagnosis not present

## 2020-09-12 DIAGNOSIS — I482 Chronic atrial fibrillation, unspecified: Secondary | ICD-10-CM | POA: Diagnosis not present

## 2020-09-12 DIAGNOSIS — R419 Unspecified symptoms and signs involving cognitive functions and awareness: Secondary | ICD-10-CM | POA: Diagnosis not present

## 2020-09-12 DIAGNOSIS — E119 Type 2 diabetes mellitus without complications: Secondary | ICD-10-CM | POA: Diagnosis not present

## 2020-09-12 DIAGNOSIS — G8929 Other chronic pain: Secondary | ICD-10-CM | POA: Diagnosis not present

## 2020-09-12 DIAGNOSIS — I110 Hypertensive heart disease with heart failure: Secondary | ICD-10-CM | POA: Diagnosis not present

## 2020-09-12 DIAGNOSIS — I5030 Unspecified diastolic (congestive) heart failure: Secondary | ICD-10-CM | POA: Diagnosis not present

## 2020-09-12 DIAGNOSIS — M25552 Pain in left hip: Secondary | ICD-10-CM | POA: Diagnosis not present

## 2020-09-12 DIAGNOSIS — Z4781 Encounter for orthopedic aftercare following surgical amputation: Secondary | ICD-10-CM | POA: Diagnosis not present

## 2020-09-12 DIAGNOSIS — E785 Hyperlipidemia, unspecified: Secondary | ICD-10-CM | POA: Diagnosis not present

## 2020-09-13 ENCOUNTER — Telehealth: Payer: Self-pay | Admitting: Orthopedic Surgery

## 2020-09-13 NOTE — Telephone Encounter (Signed)
Can you please call pt and make an appt? Was supposed to follow up earlier this month and has nothing on file. Thanks!

## 2020-09-13 NOTE — Telephone Encounter (Signed)
Terri from [facility] called. FYI, patient fell but has not injuries. Her call back number is [phone]

## 2020-09-14 ENCOUNTER — Other Ambulatory Visit: Payer: Self-pay

## 2020-09-14 ENCOUNTER — Encounter: Payer: Self-pay | Admitting: Physician Assistant

## 2020-09-14 ENCOUNTER — Ambulatory Visit: Payer: Medicare HMO | Admitting: Physician Assistant

## 2020-09-14 VITALS — BP 122/72 | HR 72 | Ht 74.0 in | Wt 207.6 lb

## 2020-09-14 DIAGNOSIS — I482 Chronic atrial fibrillation, unspecified: Secondary | ICD-10-CM | POA: Diagnosis not present

## 2020-09-14 DIAGNOSIS — E119 Type 2 diabetes mellitus without complications: Secondary | ICD-10-CM | POA: Diagnosis not present

## 2020-09-14 DIAGNOSIS — I10 Essential (primary) hypertension: Secondary | ICD-10-CM | POA: Diagnosis not present

## 2020-09-14 NOTE — Progress Notes (Signed)
Cardiology Office Note:    Date:  09/14/2020   ID:  Manuel Turner, DOB 04-16-1946, MRN [id number]  PCP:  Jarrett Soho, PA-C  [facility] Cardiologist:  Lesleigh Noe, MD  [facility] Electrophysiologist:  None   Chief Complaint: 6 months follow up   History of Present Illness:    Manuel Turner is a 75 y.o. male with a hx of permanent atrial fibrillation, hypertension, diabetes mellitus and osteomyelitis of great right toe 2nd to diabetic feet s/p Amputation 06/2020 seen for follow-up.  History of elevated blood pressure and noncompliance with medication.  His EF was > 55% by last echocardiogram in 2011. Normal ABIs Nov 2021  Here today for follow-up with daughter.  No complaints.  Denies chest pain, shortness of breath, orthopnea, PND, syncope, lower extremity edema or melena.  Past Medical History:  Diagnosis Date  . Atrial fibrillation (HCC)   . Diabetes mellitus without complication (HCC)   . Hyperlipemia   . Hypertension     Past Surgical History:  Procedure Laterality Date  . AMPUTATION Right 07/13/2020   Procedure: RIGHT GREAT TOE AMPUTATION;  Surgeon: Nadara Mustard, MD;  Location: [facility] OR;  Service: Orthopedics;  Laterality: Right;    Current Medications: Current Meds  Medication Sig  . apixaban (ELIQUIS) 5 MG TABS tablet Take 1 tablet (5 mg total) by mouth 2 (two) times daily.  Marland Kitchen atorvastatin (LIPITOR) 20 MG tablet Take 20 mg by mouth daily.  . carvedilol (COREG) 12.5 MG tablet Take 12.5 mg by mouth 2 (two) times daily with a meal.  . digoxin (LANOXIN) 0.125 MG tablet Take 0.125 mg by mouth daily.  Marland Kitchen losartan (COZAAR) 100 MG tablet Take 100 mg by mouth daily.  . metFORMIN (GLUCOPHAGE) 1000 MG tablet Take 1,000 mg by mouth daily with breakfast.   . tiZANidine (ZANAFLEX) 4 MG tablet Take 4 mg by mouth every 8 (eight) hours as needed for muscle spasms.      Allergies:   Patient has no known allergies.   Social History   Socioeconomic History  .  Marital status: Married    Spouse name: Not on file  . Number of children: Not on file  . Years of education: Not on file  . Highest education level: Not on file  Occupational History  . Not on file  Tobacco Use  . Smoking status: Former Games developer  . Smokeless tobacco: Never Used  Substance and Sexual Activity  . Alcohol use: No  . Drug use: Not on file  . Sexual activity: Not on file  Other Topics Concern  . Not on file  Social History Narrative  . Not on file   Social Determinants of Health   Financial Resource Strain: Not on file  Food Insecurity: Not on file  Transportation Needs: Not on file  Physical Activity: Not on file  Stress: Not on file  Social Connections: Not on file     Family History: The patient's Family history is unknown by patient.    ROS:   Please see the history of present illness.    All other systems reviewed and are negative.   EKGs/Labs/Other Studies Reviewed:    The following studies were reviewed today: As summarized above  EKG:  EKG is not  ordered today.    Recent Labs: 07/13/2020: ALT 31; Magnesium 1.7 07/17/2020: BUN 16; Creatinine, Ser 0.80; Hemoglobin 14.5; Platelets 236; Potassium 3.7; Sodium 134  Recent Lipid Panel No results found for: CHOL, TRIG, HDL,  CHOLHDL, VLDL, LDLCALC, LDLDIRECT   Risk Assessment/Calculations:    CHA2DS2-VASc Score = 3  { The patient's score is based upon: CHF History: No HTN History: Yes Diabetes History: Yes Stroke History: No Vascular Disease History: No Age Score: 1 Gender Score: 0   Physical Exam:    VS:  BP 122/72   Pulse 72   Ht 6\' 2"  (1.88 m)   Wt 207 lb 9.6 oz (94.2 kg)   SpO2 97%   BMI 26.65 kg/m     Wt Readings from Last 3 Encounters:  09/14/20 207 lb 9.6 oz (94.2 kg)  07/20/20 167 lb (75.8 kg)  07/17/20 167 lb (75.8 kg)     GEN:  Well nourished, well developed in no acute distress HEENT: Normal NECK: No JVD; No carotid bruits LYMPHATICS: No lymphadenopathy CARDIAC:  Irregular, no murmurs, rubs, gallops RESPIRATORY:  Clear to auscultation without rales, wheezing or rhonchi  ABDOMEN: Soft, non-tender, non-distended MUSCULOSKELETAL:  No edema SKIN: Warm and dry NEUROLOGIC:  Alert and oriented x 3 PSYCHIATRIC:  Normal affect   ASSESSMENT AND PLAN:    1. Permanent atrial fibrillation Rate stable.  No bleeding issue.  Continue current therapy.  2. Hypertension -Blood pressure stable and well-controlled on current medications.  No change.  3. Diabetes mellitus -Per PCP.  Medication Adjustments/Labs and Tests Ordered: Current medicines are reviewed at length with the patient today.  Concerns regarding medicines are outlined above.  No orders of the defined types were placed in this encounter.  No orders of the defined types were placed in this encounter.   Patient Instructions  Medication Instructions:  Your physician recommends that you continue on your current medications as directed. Please refer to the Current Medication list given to you today.  *If you need a refill on your cardiac medications before your next appointment, please call your pharmacy*   Lab Work: None ordered  If you have labs (blood work) drawn today and your tests are completely normal, you will receive your results only by: 07/19/20 MyChart Message (if you have MyChart) OR . A paper copy in the mail If you have any lab test that is abnormal or we need to change your treatment, we will call you to review the results.   Testing/Procedures: None ordered   Follow-Up: At [facility], you and your health needs are our priority.  As part of our continuing mission to provide you with exceptional heart care, we have created designated Provider Care Teams.  These Care Teams include your primary Cardiologist (physician) and Advanced Practice Providers (APPs -  Physician Assistants and Nurse Practitioners) who all work together to provide you with the care you need, when you need  it.  We recommend signing up for the patient portal called "MyChart".  Sign up information is provided on this After Visit Summary.  MyChart is used to connect with patients for Virtual Visits (Telemedicine).  Patients are able to view lab/test results, encounter notes, upcoming appointments, etc.  Non-urgent messages can be sent to your provider as well.   To learn more about what you can do with MyChart, go to [facility].    Your next appointment:   6 month(s)  The format for your next appointment:   In Person  Provider:   You may see [email or web address], MD or one of the following Advanced Practice Providers on your designated Care Team:    Lesleigh Noe, NP    Other Instructions      Signed,  Manson Passey, [place]  09/14/2020 12:06 PM    [facility]

## 2020-09-14 NOTE — Patient Instructions (Signed)
Medication Instructions:  Your physician recommends that you continue on your current medications as directed. Please refer to the Current Medication list given to you today.  *If you need a refill on your cardiac medications before your next appointment, please call your pharmacy*   Lab Work: None ordered  If you have labs (blood work) drawn today and your tests are completely normal, you will receive your results only by: . MyChart Message (if you have MyChart) OR . A paper copy in the mail If you have any lab test that is abnormal or we need to change your treatment, we will call you to review the results.   Testing/Procedures: None ordered   Follow-Up: At [facility], you and your health needs are our priority.  As part of our continuing mission to provide you with exceptional heart care, we have created designated Provider Care Teams.  These Care Teams include your primary Cardiologist (physician) and Advanced Practice Providers (APPs -  Physician Assistants and Nurse Practitioners) who all work together to provide you with the care you need, when you need it.  We recommend signing up for the patient portal called "MyChart".  Sign up information is provided on this After Visit Summary.  MyChart is used to connect with patients for Virtual Visits (Telemedicine).  Patients are able to view lab/test results, encounter notes, upcoming appointments, etc.  Non-urgent messages can be sent to your provider as well.   To learn more about what you can do with MyChart, go to [email or web address].    Your next appointment:   6 month(s)  The format for your next appointment:   In Person  Provider:   You may see Henry W Smith III, MD or one of the following Advanced Practice Providers on your designated Care Team:    Jill McDaniel, NP    Other Instructions   

## 2020-09-15 NOTE — Telephone Encounter (Signed)
Got in touch with pt and he states he was already cleared to not come back and he states he is doing fine, so there's no need for him to come back in.

## 2020-09-27 DIAGNOSIS — I4891 Unspecified atrial fibrillation: Secondary | ICD-10-CM | POA: Diagnosis not present

## 2020-09-27 DIAGNOSIS — E785 Hyperlipidemia, unspecified: Secondary | ICD-10-CM | POA: Diagnosis not present

## 2020-09-27 DIAGNOSIS — G8929 Other chronic pain: Secondary | ICD-10-CM | POA: Diagnosis not present

## 2020-09-27 DIAGNOSIS — E1169 Type 2 diabetes mellitus with other specified complication: Secondary | ICD-10-CM | POA: Diagnosis not present

## 2020-09-27 DIAGNOSIS — I10 Essential (primary) hypertension: Secondary | ICD-10-CM | POA: Diagnosis not present

## 2020-09-28 ENCOUNTER — Ambulatory Visit: Payer: Medicare HMO | Admitting: Physician Assistant

## 2020-09-28 ENCOUNTER — Encounter: Payer: Self-pay | Admitting: Physician Assistant

## 2020-09-28 DIAGNOSIS — Z89411 Acquired absence of right great toe: Secondary | ICD-10-CM

## 2020-09-28 DIAGNOSIS — S98111A Complete traumatic amputation of right great toe, initial encounter: Secondary | ICD-10-CM

## 2020-09-28 NOTE — Progress Notes (Signed)
Office Visit Note   Patient: Manuel Turner           Date of Birth: June 14, 1946           MRN: [id number] Visit Date: 09/28/2020              Requested by: Jarrett Soho, PA-C [address] [place],  [place] [address] PCP: Jarrett Soho, PA-C  No chief complaint on file.     HPI: Patient presents today for 1 week status post right great toe amputation.  He is accompanied by his daughter.  He is doing very well.  His only question is with regards to some dryness in it generalized swelling in his lower extremities.  Left seems to have more dryness than the right and she is wondering what we could do about that.  Assessment & Plan: Visit Diagnoses: No diagnosis found.  Plan: Status post great toe amputation doing well.  Also chronic venous insufficiency.  I discussed this with the patient and his daughter.  I did discuss with him that I think he would do very well wearing vive compression stocking.  He was measured for extra-large today.  He will follow-up as needed.  Given the daughter information on how to obtain these  Follow-Up Instructions: No follow-ups on file.   Ortho Exam  Patient is alert, oriented, no adenopathy, well-dressed, normal affect, normal respiratory effort. Leg amputation site is completely healed just a small amount of eschar swelling is well controlled no necrosis no cellulitis no signs of infection.  Bilaterally he has brawny skin color changes with some moderate soft tissue swelling no cellulitis.  Findings consistent with chronic venous insufficiency no ulcers  Imaging: No results found. No images are attached to the encounter.  Labs: Lab Results  Component Value Date   HGBA1C 7.0 (H) 07/10/2020   ESRSEDRATE 12 07/10/2020   ESRSEDRATE 22 (H) 07/08/2020   CRP 0.5 07/10/2020   REPTSTATUS 07/15/2020 FINAL 07/10/2020   CULT  07/10/2020    NO GROWTH 5 DAYS Performed at [facility], [address], [place], [place] [address]       Lab Results  Component Value Date   ALBUMIN 3.3 (L) 07/13/2020   ALBUMIN 3.8 07/10/2020   PREALBUMIN 15.6 (L) 07/10/2020    Lab Results  Component Value Date   MG 1.7 07/13/2020   MG 1.7 07/11/2020   No results found for: [facility]  Lab Results  Component Value Date   PREALBUMIN 15.6 (L) 07/10/2020   CBC EXTENDED Latest Ref Rng & Units 07/17/2020 07/15/2020 07/14/2020  WBC 4.0 - 10.5 K/uL 10.0 6.4 7.7  RBC 4.22 - 5.81 MIL/uL 4.83 4.67 4.65  HGB 13.0 - 17.0 g/dL 47.4 25.9 56.3  HCT 87.5 - 52.0 % 43.1 42.2 42.0  PLT 150 - 400 K/uL 236 186 182  NEUTROABS 1.7 - 7.7 K/uL 7.0 - -  LYMPHSABS 0.7 - 4.0 K/uL 2.0 - -     There is no height or weight on file to calculate BMI.  Orders:  No orders of the defined types were placed in this encounter.  No orders of the defined types were placed in this encounter.    Procedures: No procedures performed  Clinical Data: No additional findings.  ROS:  All other systems negative, except as noted in the HPI. Review of Systems  Objective: Vital Signs: There were no vitals taken for this visit.  Specialty Comments:  No specialty comments available.  PMFS History: Patient  Active Problem List   Diagnosis Date Noted  . Amputated great toe, right (HCC) 07/27/2020  . Osteomyelitis of right foot (HCC)   . Osteomyelitis of toe (HCC) 07/10/2020  . Hypertensive urgency 07/10/2020  . Hypokalemia 07/10/2020  . Hyponatremia 07/10/2020  . Type 2 diabetes mellitus with foot ulcer (HCC) 07/10/2020  . Encounter for therapeutic drug monitoring 12/17/2019  . (HFpEF) heart failure with preserved ejection fraction (HCC) 12/07/2014  . Atrial fibrillation, chronic (HCC) 03/16/2014  . Essential hypertension 03/16/2014  . Chronic anticoagulation 03/16/2014  . Bilateral lower extremity edema 03/16/2014   Past Medical History:  Diagnosis Date  . Atrial fibrillation (HCC)   . Diabetes mellitus without complication (HCC)   . Hyperlipemia    . Hypertension     Family History  Family history unknown: Yes    Past Surgical History:  Procedure Laterality Date  . AMPUTATION Right 07/13/2020   Procedure: RIGHT GREAT TOE AMPUTATION;  Surgeon: Nadara Mustard, MD;  Location: [facility] OR;  Service: Orthopedics;  Laterality: Right;   Social History   Occupational History  . Not on file  Tobacco Use  . Smoking status: Former Games developer  . Smokeless tobacco: Never Used  Substance and Sexual Activity  . Alcohol use: No  . Drug use: Not on file  . Sexual activity: Not on file

## 2020-10-14 ENCOUNTER — Other Ambulatory Visit: Payer: Self-pay

## 2020-10-14 MED ORDER — CARVEDILOL 12.5 MG PO TABS
12.5000 mg | ORAL_TABLET | Freq: Two times a day (BID) | ORAL | 3 refills | Status: DC
Start: 2020-10-14 — End: 2020-12-26

## 2020-10-14 MED ORDER — LOSARTAN POTASSIUM 100 MG PO TABS
100.0000 mg | ORAL_TABLET | Freq: Every day | ORAL | 3 refills | Status: DC
Start: 2020-10-14 — End: 2020-12-26

## 2020-10-14 NOTE — Telephone Encounter (Signed)
Pt's medication was sent to pt's pharmacy as requested. Confirmation received.  °

## 2020-11-01 ENCOUNTER — Other Ambulatory Visit: Payer: Self-pay

## 2020-11-01 MED ORDER — APIXABAN 5 MG PO TABS
5.0000 mg | ORAL_TABLET | Freq: Two times a day (BID) | ORAL | 1 refills | Status: DC
Start: 2020-11-01 — End: 2020-11-04

## 2020-11-01 NOTE — Telephone Encounter (Signed)
Pt last saw Chelsea Aus, PA on 09/14/20, last labs 07/17/20 Creat 0.80, age 75, weight 94.2kg, based on specified criteria pt is on appropriate dosage of Eliquis 5mg  BID for afib.  Will refill rx.

## 2020-11-04 ENCOUNTER — Telehealth: Payer: Self-pay | Admitting: Interventional Cardiology

## 2020-11-04 MED ORDER — APIXABAN 5 MG PO TABS
5.0000 mg | ORAL_TABLET | Freq: Two times a day (BID) | ORAL | 1 refills | Status: DC
Start: 2020-11-04 — End: 2021-05-01

## 2020-11-04 NOTE — Telephone Encounter (Signed)
*  STAT* If patient is at the pharmacy, call can be transferred to refill team.   1. Which medications need to be refilled? (please list name of each medication and dose if known)   apixaban (ELIQUIS) 5 MG TABS tablet    2. Which pharmacy/location (including street and city if local pharmacy) is medication to be sent to? [id number] - [place], [place] - [address]  3. Do they need a 30 day or 90 day supply? 90 day   Patient's daughter states the patient has dementia and will check the male for his prescription. She states the refill sent to [facility] needs to be cancelled and [facility] to [facility]. She states he is out of medication.

## 2020-11-04 NOTE — Telephone Encounter (Addendum)
Eliquis 5mg  refill request received to send to local pharmacy instead of mail order. Patient is 75 years old, weight-94.2kg, Crea-0.80 on 07/17/2020, Diagnosis-Afib, and last seen by Vin PA on 09/14/2020. Dose is appropriate based on dosing criteria. Will send in refill to requested pharmacy.   Called [employer] mail order since the dtr does not wasn't it sent to mail order but to local pharmacy. Spoke with 09/16/2020, Pharmacist and he will stop the order on the mail order refill.   Spoke with Lennie Hummer after calling the primary number and she was updated that I called [employer] and stopped shipment before Eliquis was sent out and sent to local [facility]. Also, she states to remove home phone off the list and keep the cell number. Removed home phone as requested and verified the other numbers as daughters Lurena Joiner and Bernita Buffy are primary and Linda-spouse is secondary.

## 2020-11-06 DIAGNOSIS — R600 Localized edema: Secondary | ICD-10-CM | POA: Diagnosis not present

## 2020-11-06 DIAGNOSIS — M545 Low back pain, unspecified: Secondary | ICD-10-CM | POA: Diagnosis not present

## 2020-11-06 DIAGNOSIS — I10 Essential (primary) hypertension: Secondary | ICD-10-CM | POA: Diagnosis not present

## 2020-11-22 DIAGNOSIS — M7989 Other specified soft tissue disorders: Secondary | ICD-10-CM | POA: Diagnosis not present

## 2020-11-22 DIAGNOSIS — E1169 Type 2 diabetes mellitus with other specified complication: Secondary | ICD-10-CM | POA: Diagnosis not present

## 2020-11-22 DIAGNOSIS — I4891 Unspecified atrial fibrillation: Secondary | ICD-10-CM | POA: Diagnosis not present

## 2020-11-22 DIAGNOSIS — I509 Heart failure, unspecified: Secondary | ICD-10-CM | POA: Diagnosis not present

## 2020-11-22 DIAGNOSIS — R4182 Altered mental status, unspecified: Secondary | ICD-10-CM | POA: Diagnosis not present

## 2020-11-22 DIAGNOSIS — I10 Essential (primary) hypertension: Secondary | ICD-10-CM | POA: Diagnosis not present

## 2020-11-23 DIAGNOSIS — F0280 Dementia in other diseases classified elsewhere without behavioral disturbance: Secondary | ICD-10-CM | POA: Diagnosis not present

## 2020-11-23 DIAGNOSIS — R030 Elevated blood-pressure reading, without diagnosis of hypertension: Secondary | ICD-10-CM | POA: Diagnosis not present

## 2020-11-23 DIAGNOSIS — F0390 Unspecified dementia without behavioral disturbance: Secondary | ICD-10-CM | POA: Diagnosis not present

## 2020-11-23 DIAGNOSIS — G301 Alzheimer's disease with late onset: Secondary | ICD-10-CM | POA: Diagnosis not present

## 2020-11-23 DIAGNOSIS — H9193 Unspecified hearing loss, bilateral: Secondary | ICD-10-CM | POA: Diagnosis not present

## 2020-11-23 DIAGNOSIS — R413 Other amnesia: Secondary | ICD-10-CM | POA: Diagnosis not present

## 2020-11-25 ENCOUNTER — Telehealth: Payer: Self-pay | Admitting: Physician Assistant

## 2020-11-25 DIAGNOSIS — E119 Type 2 diabetes mellitus without complications: Secondary | ICD-10-CM

## 2020-11-25 MED ORDER — ATORVASTATIN CALCIUM 20 MG PO TABS: 20.0000 mg | ORAL_TABLET | Freq: Every day | ORAL | 0 refills | Status: AC

## 2020-11-25 NOTE — Telephone Encounter (Addendum)
Noted that there is a follow up with PCP on 11/29/20 to recheck his legs. PCP started lasix 40 mg daily, gave 7 day supply, also recommended sodium restriction and watch water intake. Difficultly with compression stockings because of dementia. Dianna wanted to note that he has blisters on his right leg but the left leg swells a little more than the right. Some opened blisters seem to be healing nicely. No weeping seems to be taking place. Does not have current weight today but advised to weight daily in the morning a keep record going forward. Advised Dianna that Dr. Katrinka Blazing schedule was full next week and Vin is not in the office. Will forward to Dr. Michaelle Copas nurse to follow up next week to see how he is doing and if they need to work him in for follow up.   Requested refill for Lipitor, PCP advised that cardiology needs to follow this going forward.  PharmD refilled with recommendation for follow up.

## 2020-11-25 NOTE — Telephone Encounter (Signed)
New Message:    Pt saw his primary doctor on Wednesday, she wanted pt to see his Cardiologist for swelling. She wants pt to be seen by next week please.  Pt c/o swelling: STAT is pt has developed SOB within 24 hours  1) How much weight have you gained and in what time span? 10 lbs in 3weeks and have lost 4 lbs  2) If swelling, where is the swelling located? yes  3) Are you currently taking a fluid pill? yes  4) Are you currently SOB? no  5) Do you have a log of your daily weights (if so, list)? No  6) Have you gained 3 pounds in a day or 5 pounds in a week? No         7.     Have you traveled recently? No  Please call to evaluate

## 2020-11-29 DIAGNOSIS — M7989 Other specified soft tissue disorders: Secondary | ICD-10-CM | POA: Diagnosis not present

## 2020-11-30 NOTE — Telephone Encounter (Signed)
Spoke with daughter to check in on pt.  Swelling is much better.  Weight was 220lbs and is now down to 210lbs.  Had check up with PCP yesterday and they are referring pt to a vascular doctor.  Daughter wanted to know if Dr. Katrinka Blazing felt this was appropriate?  Will route to Dr. Katrinka Blazing for review.

## 2020-12-01 NOTE — Telephone Encounter (Signed)
I cannot speak to why the PCP would order a vascular consult.  I would trust the judgment of the PCP.

## 2020-12-01 NOTE — Telephone Encounter (Signed)
Spoke with Dianna and made her aware of info from Dr. Katrinka Blazing.  She will plan for pt to see Vascular doctor.  Daughter appreciative for call

## 2020-12-20 DIAGNOSIS — G301 Alzheimer's disease with late onset: Secondary | ICD-10-CM | POA: Diagnosis not present

## 2020-12-20 DIAGNOSIS — G319 Degenerative disease of nervous system, unspecified: Secondary | ICD-10-CM | POA: Diagnosis not present

## 2020-12-20 DIAGNOSIS — F0280 Dementia in other diseases classified elsewhere without behavioral disturbance: Secondary | ICD-10-CM | POA: Diagnosis not present

## 2020-12-26 ENCOUNTER — Other Ambulatory Visit: Payer: Self-pay | Admitting: *Deleted

## 2020-12-26 DIAGNOSIS — H903 Sensorineural hearing loss, bilateral: Secondary | ICD-10-CM | POA: Diagnosis not present

## 2020-12-26 MED ORDER — LOSARTAN POTASSIUM 100 MG PO TABS: 100.0000 mg | ORAL_TABLET | Freq: Every day | ORAL | 3 refills | Status: AC

## 2020-12-26 MED ORDER — CARVEDILOL 12.5 MG PO TABS: 12.5000 mg | ORAL_TABLET | Freq: Two times a day (BID) | ORAL | 3 refills | Status: AC

## 2020-12-30 ENCOUNTER — Other Ambulatory Visit: Payer: Self-pay | Admitting: *Deleted

## 2020-12-30 DIAGNOSIS — R600 Localized edema: Secondary | ICD-10-CM

## 2021-01-03 ENCOUNTER — Encounter (HOSPITAL_COMMUNITY): Payer: Medicare HMO

## 2021-01-17 ENCOUNTER — Ambulatory Visit: Payer: Medicare HMO | Admitting: Physician Assistant

## 2021-01-17 ENCOUNTER — Other Ambulatory Visit: Payer: Self-pay

## 2021-01-17 ENCOUNTER — Ambulatory Visit (HOSPITAL_COMMUNITY)
Admission: RE | Admit: 2021-01-17 | Discharge: 2021-01-17 | Disposition: A | Discharge status: Home / Self Care | Payer: Medicare HMO | Source: Ambulatory Visit | Attending: Physician Assistant | Admitting: Physician Assistant

## 2021-01-17 VITALS — BP 102/58 | HR 48 | Temp 98.1°F | Resp 20 | Ht 74.0 in | Wt 217.4 lb

## 2021-01-17 DIAGNOSIS — R600 Localized edema: Secondary | ICD-10-CM

## 2021-01-17 NOTE — Progress Notes (Signed)
[facility]           OF [place]  History and Physical   Manuel Turner is a 75 y.o. male who presents with bilateral lower extremity leg swelling.  He is here with his daughter.  They tell me that his PCP wanted to have his veins evaluated before continuing the lasix.  He does not wear compression as they have had a hard time putting the compression on and he really does not elevate his legs.  He denies any family hx.  He does have skin changes in the legs bilaterally.  He does not have any hx of DVT in the past.  He is up and about quite a bit.  He does have a hx of heart failure and echo in 2011 revealed preserved heart failure.   He is on Eliquis, BB and digoxin for afib.    The pt is on a statin for cholesterol management.  The pt is not on a daily aspirin.   Other AC:  Eliquis The pt is on BB, ARB,  for hypertension.   The pt is diabetic.   Tobacco hx:  former   Past Medical History:  Diagnosis Date  . Atrial fibrillation (HCC)   . Diabetes mellitus without complication (HCC)   . Hyperlipemia   . Hypertension     Past Surgical History:  Procedure Laterality Date  . AMPUTATION Right 07/13/2020   Procedure: RIGHT GREAT TOE AMPUTATION;  Surgeon: Nadara Mustard, MD;  Location: [facility] OR;  Service: Orthopedics;  Laterality: Right;    Social History   Socioeconomic History  . Marital status: Married    Spouse name: Not on file  . Number of children: Not on file  . Years of education: Not on file  . Highest education level: Not on file  Occupational History  . Not on file  Tobacco Use  . Smoking status: Former Games developer  . Smokeless tobacco: Never Used  Substance and Sexual Activity  . Alcohol use: No  . Drug use: Not on file  . Sexual activity: Not on file  Other Topics Concern  . Not on file  Social History Narrative  . Not on file   Social Determinants of Health   Financial Resource Strain: Not on file  Food Insecurity: Not on file   Transportation Needs: Not on file  Physical Activity: Not on file  Stress: Not on file  Social Connections: Not on file  Intimate Partner Violence: Not on file    Family History  Family history unknown: Yes    Current Outpatient Medications  Medication Sig Dispense Refill  . apixaban (ELIQUIS) 5 MG TABS tablet Take 1 tablet (5 mg total) by mouth 2 (two) times daily. 180 tablet 1  . atorvastatin (LIPITOR) 20 MG tablet Take 1 tablet (20 mg total) by mouth daily. 30 tablet 0  . carvedilol (COREG) 12.5 MG tablet Take 1 tablet (12.5 mg total) by mouth 2 (two) times daily with a meal. 180 tablet 3  . digoxin (LANOXIN) 0.125 MG tablet Take 0.125 mg by mouth daily.    Marland Kitchen donepezil (ARICEPT) 5 MG tablet Take by mouth.    . furosemide (LASIX) 40 MG tablet     . losartan (COZAAR) 100 MG tablet Take 1 tablet (100 mg total) by mouth daily. 90 tablet 3  . metFORMIN (GLUCOPHAGE) 1000 MG tablet Take 1,000 mg by mouth daily with breakfast.     .  tiZANidine (ZANAFLEX) 4 MG tablet Take 4 mg by mouth every 8 (eight) hours as needed for muscle spasms.      No current facility-administered medications for this visit.    No Known Allergies  REVIEW OF SYSTEMS:   [X]  denotes positive finding, [ ]  denotes negative finding Cardiac  Comments:  Chest pain or chest pressure:    Shortness of breath upon exertion:    Short of breath when lying flat:    Irregular heart rhythm:        Vascular    Pain in calf, thigh, or hip brought on by ambulation:    Pain in feet at night that wakes you up from your sleep:     Blood clot in your veins:    Leg swelling:  x       Pulmonary    Oxygen at home:    Productive cough:     Wheezing:         Neurologic    Sudden weakness in arms or legs:     Sudden numbness in arms or legs:     Sudden onset of difficulty speaking or slurred speech:    Temporary loss of vision in one eye:     Problems with dizziness:         Gastrointestinal    Blood in stool:      Vomited blood:         Genitourinary    Burning when urinating:     Blood in urine:        Psychiatric    Major depression:         Hematologic    Bleeding problems:    Problems with blood clotting too easily:        Skin    Rashes or ulcers:        Constitutional    Fever or chills:      PHYSICAL EXAMINATION:  Today's Vitals   01/17/21 1355  BP: (!) 102/58  Pulse: (!) 48  Resp: 20  Temp: 98.1 F (36.7 C)  TempSrc: Temporal  SpO2: 97%  Weight: 217 lb 6.4 oz (98.6 kg)  Height: 6\' 2"  (1.88 m)  PainSc: 0-No pain   Body mass index is 27.91 kg/m.   General:  WDWN in NAD; vital signs documented above Gait: Not observed HENT: WNL, normocephalic Pulmonary: normal non-labored breathing without wheezing Cardiac: regular HR; without carotid bruits Abdomen: soft, NT, no masses; aortic pulse is not palpable Skin: without rashes Vascular Exam/Pulses:  Right Left  Radial 2+ (normal) 2+ (normal)  DP 2+ (normal) 2+ (normal)  PT Unable to palpate Unable to palpate   Extremities:      Neurologic: A&O X 3;  moving all extremities equally Psychiatric:  The pt has Normal affect.   Non-Invasive Vascular Imaging:   Venous duplex on 01/17/2021: Venous Reflux Times  +--------------+---------+------+-----------+------------+----------------+  RIGHT     Reflux NoRefluxReflux TimeDiameter cmsComments               Yes                       +--------------+---------+------+-----------+------------+----------------+  CFV            yes  >1 second                 +--------------+---------+------+-----------+------------+----------------+  FV mid    no                             +--------------+---------+------+-----------+------------+----------------+  Popliteal         yes  >1 second                  +--------------+---------+------+-----------+------------+----------------+  GSV at [facility]        yes  >500 ms   0.69            +--------------+---------+------+-----------+------------+----------------+  GSV prox thigh      yes  >500 ms   0.66            +--------------+---------+------+-----------+------------+----------------+  GSV mid thigh no               0.36            +--------------+---------+------+-----------+------------+----------------+  GSV dist thighno               0.31            +--------------+---------+------+-----------+------------+----------------+  GSV at knee        yes  >500 ms   0.28  chronic thrombus  +--------------+---------+------+-----------+------------+----------------+  SSV Pop Fossa no               0.28            +--------------+---------+------+-----------+------------+----------------+    +------------------+---------+------+-----------+------------+-------------  LEFT       Reflux NoRefluxReflux TimeDiameter cmsComments                   Yes                     +------------------+---------+------+-----------+------------+-------------  CFV              yes  >1 second               +------------------+---------+------+-----------+------------+-------------  FV mid      no                           +------------------+---------+------+-----------+------------+-------------  Popliteal           yes  >1 second               +------------------+---------+------+-----------+------------+-------------  GSV at SFJ          yes  >500 ms   0.73          +------------------+---------+------+-----------+------------+-------------    GSV prox thigh  no               0.46          +------------------+---------+------+-----------+------------+-------------  GSV mid thigh   no               0.44          +------------------+---------+------+-----------+------------+-------------  GSV dist thigh  no               0.37          +------------------+---------+------+-----------+------------+-------------  GSV at knee          yes             chronic thrombus  +------------------+---------+------+-----------+------------+-------------   SSV Pop Fossa   no               0.32  chronic thrombus  +------------------+---------+------+-----------+------------+-------------  anterior accessory      yes  >500 ms   0.47          +------------------+---------+------+-----------+------------+-------------  - Summary:  Right:  - No evidence of deep vein thrombosis from the common femoral through the popliteal veins.  - Chronic thrombus involing the  great saphenous vein at the knee.  - The deep venous system is not competent.  - The great saphenous vein is not competent.  - The small saphenous vein is competent.    Left:  - No evidence of deep vein thrombosis from the common femoral through the popliteal veins.  - Chronic thrombus involving the great saphenous vein at the knee and small saphenous vein.  - The deep venous system is not competent.  - The great saphenous vein is competent.  - The anterior accessory branch is not competent.  - The small saphenous vein is competent.    Manuel Turner is a 75 y.o. male who presents with: BLE swelling  -pt has palpable DP pedal pulses bilaterally -pt does not have evidence of DVT.  He does have evidence of chronic thrombus in the GSV at the knee bilaterally and the SSV on the left.  Discussed with daughter that it cannot be  determined how long it has been present except that it is not acute.   -Pt does have venous reflux in the deep and superficial system bilaterally.  He does not have reflux throughout and the vein is not of diameter that would qualify for laser ablation.  -discussed with pt about wearing compression stockings - they do have a pair of Dr. Audrie Liauda's socks. -discussed the importance of leg elevation and how to elevate properly - pt is advised to elevate their legs and a diagram is given to them to demonstrate to lay flat on their back with knees elevated and slightly bent with their feet higher than her knees, which puts their feet higher than their heart for 15 minutes per day.  If they cannot lay flat, advised to lay as flat as possible.  -pt is advised to continue as much walking as possible and avoid sitting or standing for long periods of time.  He does quite a bit of walking. -discussed importance of weight loss and exercise and that water aerobics would also be beneficial.  -handout with recommendations given -pt will f/u as needed.    Manuel MassedSamantha Taijah Turner, [facility] 01/17/2021 2:03 PM  Clinic MD:  Chestine Sporelark

## 2021-02-06 IMAGING — DX DG CHEST 1V
1 series · 3 of 3 positions shown · non-contrast
Comparison: None.

CLINICAL DATA: Preoperative evaluation.  Right foot osteomyelitis.

EXAM:
CHEST  1 VIEW

[Series 1: chest ap · 0.14mm/px · 3 of 3 slices shown]
[im 1/3]
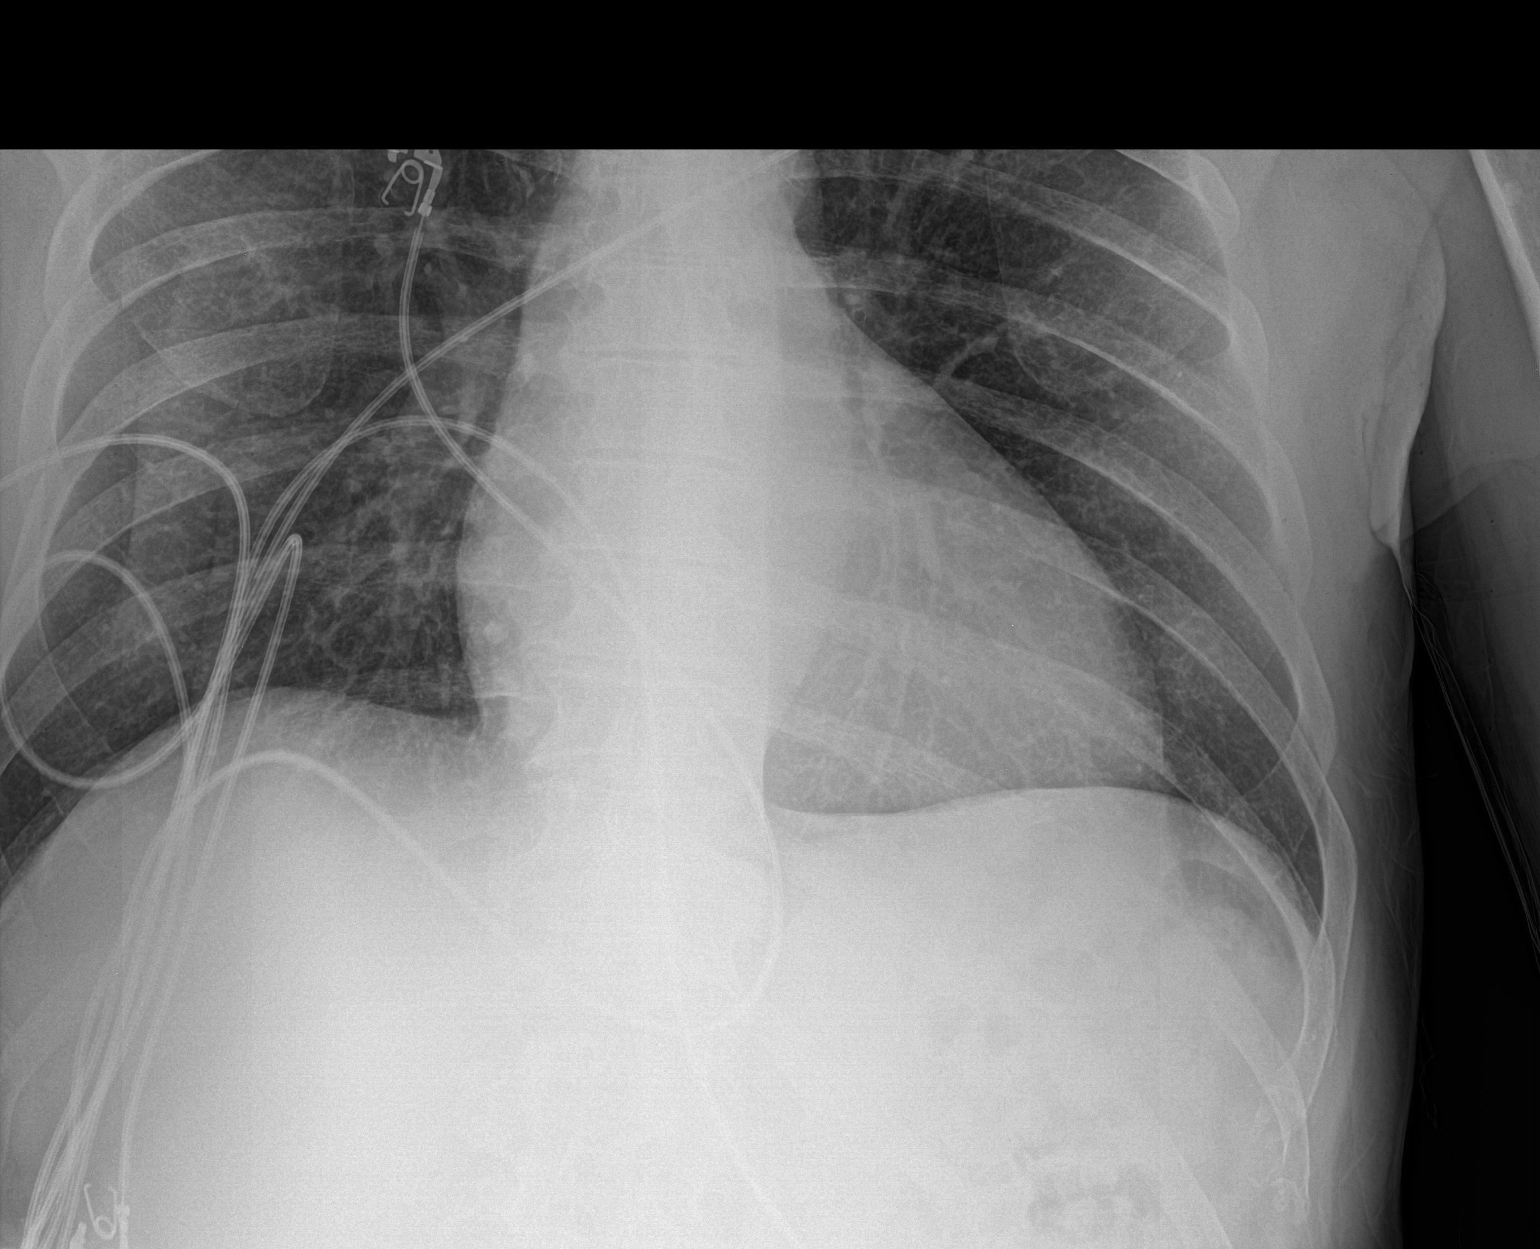
[im 2/3]
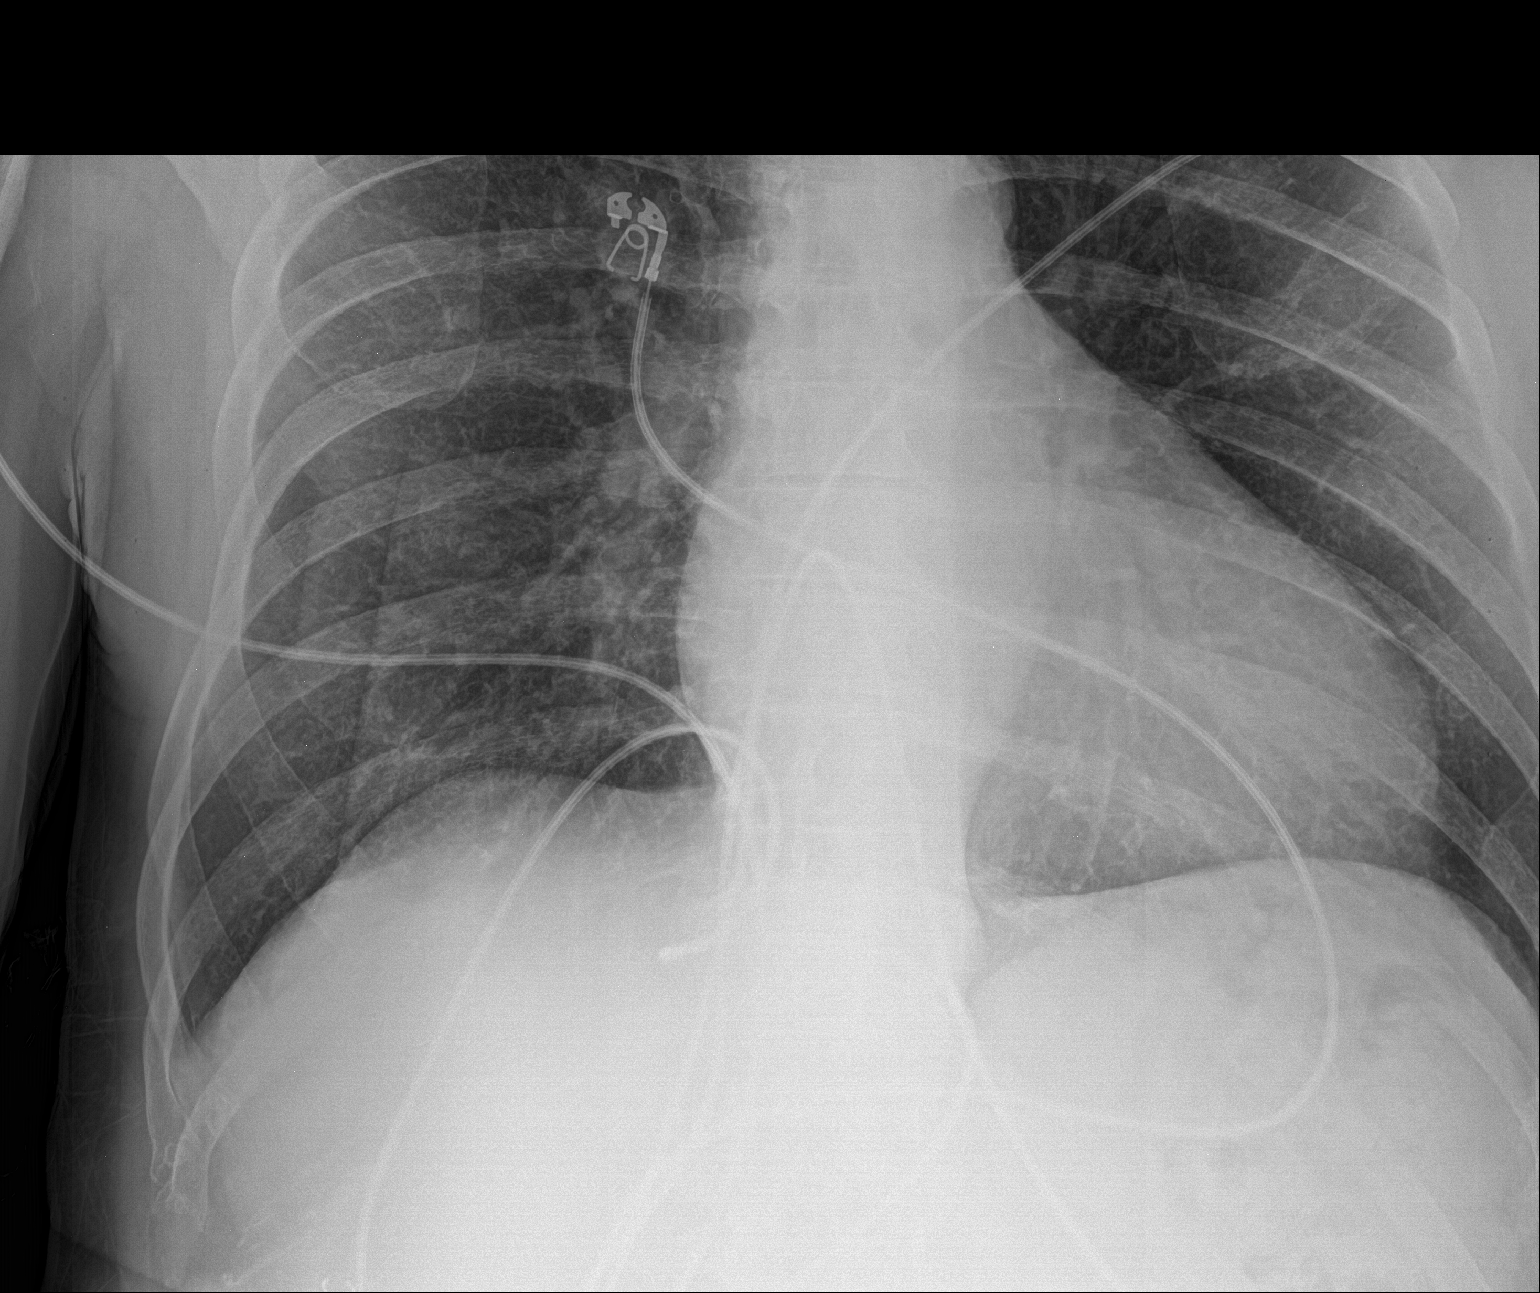
[im 3/3]
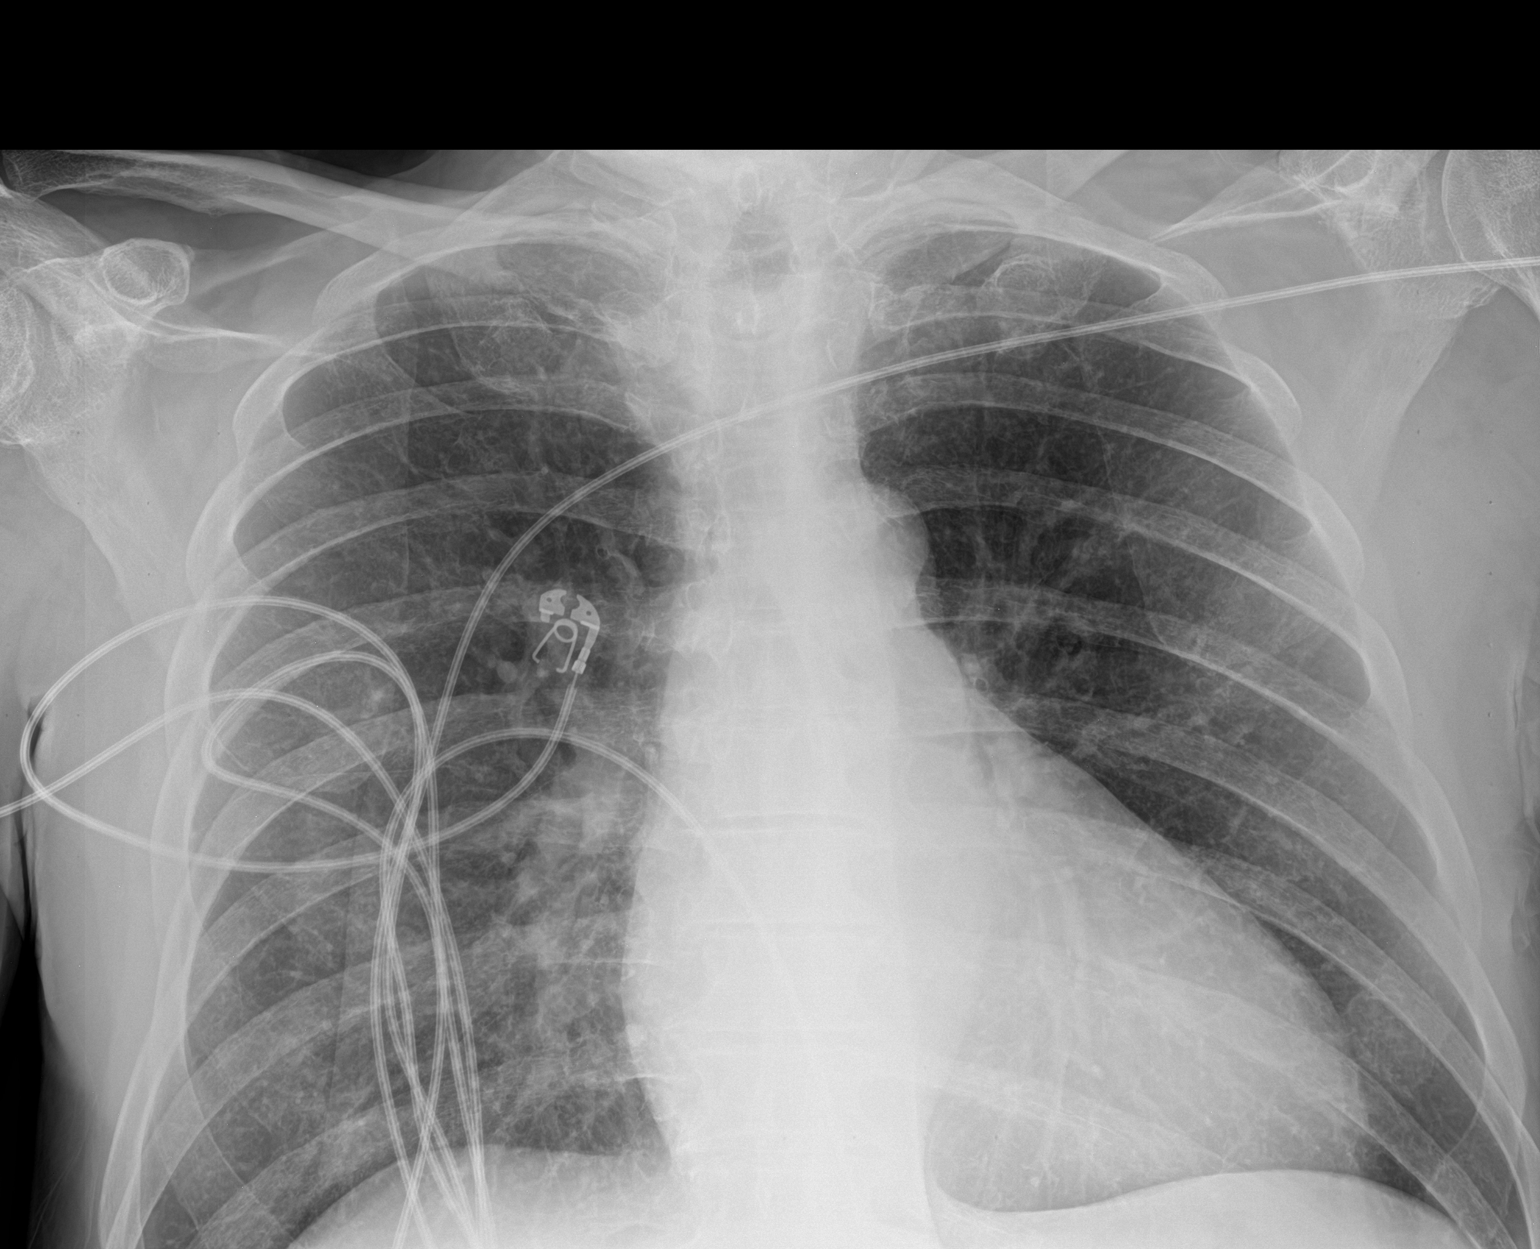

[3 of 3 positions shown; findings below may reference images not displayed]

FINDINGS: The heart size and mediastinal contours are within normal limits.
Both lungs are clear. The visualized skeletal structures are
unremarkable.
IMPRESSION: No active disease.

## 2021-02-23 DIAGNOSIS — F0280 Dementia in other diseases classified elsewhere without behavioral disturbance: Secondary | ICD-10-CM | POA: Diagnosis not present

## 2021-02-23 DIAGNOSIS — Z79899 Other long term (current) drug therapy: Secondary | ICD-10-CM | POA: Diagnosis not present

## 2021-02-23 DIAGNOSIS — G301 Alzheimer's disease with late onset: Secondary | ICD-10-CM | POA: Diagnosis not present

## 2021-03-23 DIAGNOSIS — G8929 Other chronic pain: Secondary | ICD-10-CM | POA: Diagnosis not present

## 2021-03-23 DIAGNOSIS — E785 Hyperlipidemia, unspecified: Secondary | ICD-10-CM | POA: Diagnosis not present

## 2021-03-23 DIAGNOSIS — I10 Essential (primary) hypertension: Secondary | ICD-10-CM | POA: Diagnosis not present

## 2021-03-23 DIAGNOSIS — I509 Heart failure, unspecified: Secondary | ICD-10-CM | POA: Diagnosis not present

## 2021-03-23 DIAGNOSIS — E1169 Type 2 diabetes mellitus with other specified complication: Secondary | ICD-10-CM | POA: Diagnosis not present

## 2021-03-23 DIAGNOSIS — I4891 Unspecified atrial fibrillation: Secondary | ICD-10-CM | POA: Diagnosis not present

## 2021-04-05 DIAGNOSIS — Z7901 Long term (current) use of anticoagulants: Secondary | ICD-10-CM | POA: Diagnosis not present

## 2021-04-05 DIAGNOSIS — B351 Tinea unguium: Secondary | ICD-10-CM | POA: Diagnosis not present

## 2021-04-05 DIAGNOSIS — R4182 Altered mental status, unspecified: Secondary | ICD-10-CM | POA: Diagnosis not present

## 2021-04-05 DIAGNOSIS — E1169 Type 2 diabetes mellitus with other specified complication: Secondary | ICD-10-CM | POA: Diagnosis not present

## 2021-04-05 DIAGNOSIS — E785 Hyperlipidemia, unspecified: Secondary | ICD-10-CM | POA: Diagnosis not present

## 2021-04-05 DIAGNOSIS — I4891 Unspecified atrial fibrillation: Secondary | ICD-10-CM | POA: Diagnosis not present

## 2021-04-05 DIAGNOSIS — M7989 Other specified soft tissue disorders: Secondary | ICD-10-CM | POA: Diagnosis not present

## 2021-05-01 ENCOUNTER — Other Ambulatory Visit: Payer: Self-pay | Admitting: Interventional Cardiology

## 2021-05-01 NOTE — Telephone Encounter (Signed)
Eliquis 5 mg refill request received. Patient is 75 years old, weight-98.6 kg, Crea- 0.72 on 11/23/20, Diagnosis- afib, and last seen by BB, PA on 09/14/20. Dose is appropriate based on dosing criteria. Will send in refill to requested pharmacy.

## 2021-05-15 DIAGNOSIS — S91302A Unspecified open wound, left foot, initial encounter: Secondary | ICD-10-CM | POA: Diagnosis not present

## 2021-05-15 DIAGNOSIS — L089 Local infection of the skin and subcutaneous tissue, unspecified: Secondary | ICD-10-CM | POA: Diagnosis not present

## 2021-05-19 DIAGNOSIS — L97422 Non-pressure chronic ulcer of left heel and midfoot with fat layer exposed: Secondary | ICD-10-CM | POA: Diagnosis not present

## 2021-05-19 DIAGNOSIS — M7989 Other specified soft tissue disorders: Secondary | ICD-10-CM | POA: Diagnosis not present

## 2021-05-19 DIAGNOSIS — E1142 Type 2 diabetes mellitus with diabetic polyneuropathy: Secondary | ICD-10-CM | POA: Diagnosis not present

## 2021-05-19 DIAGNOSIS — E11621 Type 2 diabetes mellitus with foot ulcer: Secondary | ICD-10-CM | POA: Diagnosis not present

## 2021-05-19 DIAGNOSIS — L97421 Non-pressure chronic ulcer of left heel and midfoot limited to breakdown of skin: Secondary | ICD-10-CM | POA: Diagnosis not present

## 2021-05-19 DIAGNOSIS — I878 Other specified disorders of veins: Secondary | ICD-10-CM | POA: Diagnosis not present

## 2021-05-23 DIAGNOSIS — I872 Venous insufficiency (chronic) (peripheral): Secondary | ICD-10-CM | POA: Diagnosis not present

## 2021-05-23 DIAGNOSIS — L97421 Non-pressure chronic ulcer of left heel and midfoot limited to breakdown of skin: Secondary | ICD-10-CM | POA: Diagnosis not present

## 2021-05-23 DIAGNOSIS — R0989 Other specified symptoms and signs involving the circulatory and respiratory systems: Secondary | ICD-10-CM | POA: Diagnosis not present

## 2021-05-23 DIAGNOSIS — Z89411 Acquired absence of right great toe: Secondary | ICD-10-CM | POA: Diagnosis not present

## 2021-05-23 DIAGNOSIS — E08621 Diabetes mellitus due to underlying condition with foot ulcer: Secondary | ICD-10-CM | POA: Diagnosis not present

## 2021-05-24 DIAGNOSIS — I878 Other specified disorders of veins: Secondary | ICD-10-CM | POA: Diagnosis not present

## 2021-05-24 DIAGNOSIS — E1142 Type 2 diabetes mellitus with diabetic polyneuropathy: Secondary | ICD-10-CM | POA: Diagnosis not present

## 2021-05-24 DIAGNOSIS — M7989 Other specified soft tissue disorders: Secondary | ICD-10-CM | POA: Diagnosis not present

## 2021-05-24 DIAGNOSIS — E11621 Type 2 diabetes mellitus with foot ulcer: Secondary | ICD-10-CM | POA: Diagnosis not present

## 2021-05-24 DIAGNOSIS — L97422 Non-pressure chronic ulcer of left heel and midfoot with fat layer exposed: Secondary | ICD-10-CM | POA: Diagnosis not present

## 2021-05-24 DIAGNOSIS — L97421 Non-pressure chronic ulcer of left heel and midfoot limited to breakdown of skin: Secondary | ICD-10-CM | POA: Diagnosis not present

## 2021-05-30 DIAGNOSIS — I872 Venous insufficiency (chronic) (peripheral): Secondary | ICD-10-CM | POA: Diagnosis not present

## 2021-05-31 DIAGNOSIS — L97421 Non-pressure chronic ulcer of left heel and midfoot limited to breakdown of skin: Secondary | ICD-10-CM | POA: Diagnosis not present

## 2021-05-31 DIAGNOSIS — I872 Venous insufficiency (chronic) (peripheral): Secondary | ICD-10-CM | POA: Diagnosis not present

## 2021-05-31 DIAGNOSIS — E1142 Type 2 diabetes mellitus with diabetic polyneuropathy: Secondary | ICD-10-CM | POA: Diagnosis not present

## 2021-06-06 DIAGNOSIS — L97421 Non-pressure chronic ulcer of left heel and midfoot limited to breakdown of skin: Secondary | ICD-10-CM | POA: Diagnosis not present

## 2021-06-06 DIAGNOSIS — M7989 Other specified soft tissue disorders: Secondary | ICD-10-CM | POA: Diagnosis not present

## 2021-06-06 DIAGNOSIS — I878 Other specified disorders of veins: Secondary | ICD-10-CM | POA: Diagnosis not present

## 2021-06-06 DIAGNOSIS — E08621 Diabetes mellitus due to underlying condition with foot ulcer: Secondary | ICD-10-CM | POA: Diagnosis not present

## 2021-06-06 DIAGNOSIS — E1142 Type 2 diabetes mellitus with diabetic polyneuropathy: Secondary | ICD-10-CM | POA: Diagnosis not present

## 2021-06-07 DIAGNOSIS — I878 Other specified disorders of veins: Secondary | ICD-10-CM | POA: Diagnosis not present

## 2021-06-07 DIAGNOSIS — L97421 Non-pressure chronic ulcer of left heel and midfoot limited to breakdown of skin: Secondary | ICD-10-CM | POA: Diagnosis not present

## 2021-06-07 DIAGNOSIS — E11621 Type 2 diabetes mellitus with foot ulcer: Secondary | ICD-10-CM | POA: Diagnosis not present

## 2021-06-07 DIAGNOSIS — L97422 Non-pressure chronic ulcer of left heel and midfoot with fat layer exposed: Secondary | ICD-10-CM | POA: Diagnosis not present

## 2021-06-07 DIAGNOSIS — E1142 Type 2 diabetes mellitus with diabetic polyneuropathy: Secondary | ICD-10-CM | POA: Diagnosis not present

## 2021-06-09 DIAGNOSIS — I872 Venous insufficiency (chronic) (peripheral): Secondary | ICD-10-CM | POA: Diagnosis not present

## 2021-06-14 DIAGNOSIS — E1142 Type 2 diabetes mellitus with diabetic polyneuropathy: Secondary | ICD-10-CM | POA: Diagnosis not present

## 2021-06-14 DIAGNOSIS — I878 Other specified disorders of veins: Secondary | ICD-10-CM | POA: Diagnosis not present

## 2021-06-14 DIAGNOSIS — L97421 Non-pressure chronic ulcer of left heel and midfoot limited to breakdown of skin: Secondary | ICD-10-CM | POA: Diagnosis not present

## 2021-06-15 DIAGNOSIS — I872 Venous insufficiency (chronic) (peripheral): Secondary | ICD-10-CM | POA: Diagnosis not present

## 2021-06-21 DIAGNOSIS — E11621 Type 2 diabetes mellitus with foot ulcer: Secondary | ICD-10-CM | POA: Diagnosis not present

## 2021-06-21 DIAGNOSIS — L97421 Non-pressure chronic ulcer of left heel and midfoot limited to breakdown of skin: Secondary | ICD-10-CM | POA: Diagnosis not present

## 2021-06-21 DIAGNOSIS — E1142 Type 2 diabetes mellitus with diabetic polyneuropathy: Secondary | ICD-10-CM | POA: Diagnosis not present

## 2021-06-27 DIAGNOSIS — L97421 Non-pressure chronic ulcer of left heel and midfoot limited to breakdown of skin: Secondary | ICD-10-CM | POA: Diagnosis not present

## 2021-06-27 DIAGNOSIS — F0280 Dementia in other diseases classified elsewhere without behavioral disturbance: Secondary | ICD-10-CM | POA: Diagnosis not present

## 2021-06-27 DIAGNOSIS — E785 Hyperlipidemia, unspecified: Secondary | ICD-10-CM | POA: Diagnosis not present

## 2021-06-27 DIAGNOSIS — R61 Generalized hyperhidrosis: Secondary | ICD-10-CM | POA: Diagnosis not present

## 2021-06-27 DIAGNOSIS — L03116 Cellulitis of left lower limb: Secondary | ICD-10-CM | POA: Diagnosis not present

## 2021-06-27 DIAGNOSIS — E878 Other disorders of electrolyte and fluid balance, not elsewhere classified: Secondary | ICD-10-CM | POA: Diagnosis not present

## 2021-06-27 DIAGNOSIS — L97529 Non-pressure chronic ulcer of other part of left foot with unspecified severity: Secondary | ICD-10-CM | POA: Diagnosis not present

## 2021-06-27 DIAGNOSIS — I6782 Cerebral ischemia: Secondary | ICD-10-CM | POA: Diagnosis not present

## 2021-06-27 DIAGNOSIS — E1165 Type 2 diabetes mellitus with hyperglycemia: Secondary | ICD-10-CM | POA: Diagnosis not present

## 2021-06-27 DIAGNOSIS — E1142 Type 2 diabetes mellitus with diabetic polyneuropathy: Secondary | ICD-10-CM | POA: Diagnosis not present

## 2021-06-27 DIAGNOSIS — I4891 Unspecified atrial fibrillation: Secondary | ICD-10-CM | POA: Diagnosis not present

## 2021-06-27 DIAGNOSIS — G319 Degenerative disease of nervous system, unspecified: Secondary | ICD-10-CM | POA: Diagnosis not present

## 2021-06-27 DIAGNOSIS — M25561 Pain in right knee: Secondary | ICD-10-CM | POA: Diagnosis not present

## 2021-06-27 DIAGNOSIS — Z20822 Contact with and (suspected) exposure to covid-19: Secondary | ICD-10-CM | POA: Diagnosis not present

## 2021-06-27 DIAGNOSIS — R231 Pallor: Secondary | ICD-10-CM | POA: Diagnosis not present

## 2021-06-27 DIAGNOSIS — R55 Syncope and collapse: Secondary | ICD-10-CM | POA: Diagnosis not present

## 2021-06-27 DIAGNOSIS — I878 Other specified disorders of veins: Secondary | ICD-10-CM | POA: Diagnosis not present

## 2021-06-27 DIAGNOSIS — R404 Transient alteration of awareness: Secondary | ICD-10-CM | POA: Diagnosis not present

## 2021-06-27 DIAGNOSIS — M7989 Other specified soft tissue disorders: Secondary | ICD-10-CM | POA: Diagnosis not present

## 2021-06-27 DIAGNOSIS — E11621 Type 2 diabetes mellitus with foot ulcer: Secondary | ICD-10-CM | POA: Diagnosis not present

## 2021-06-27 DIAGNOSIS — M25552 Pain in left hip: Secondary | ICD-10-CM | POA: Diagnosis not present

## 2021-06-27 DIAGNOSIS — Z66 Do not resuscitate: Secondary | ICD-10-CM | POA: Diagnosis not present

## 2021-06-27 DIAGNOSIS — M25562 Pain in left knee: Secondary | ICD-10-CM | POA: Diagnosis not present

## 2021-06-27 DIAGNOSIS — R600 Localized edema: Secondary | ICD-10-CM | POA: Diagnosis not present

## 2021-06-27 DIAGNOSIS — L089 Local infection of the skin and subcutaneous tissue, unspecified: Secondary | ICD-10-CM | POA: Diagnosis not present

## 2021-06-27 DIAGNOSIS — F02818 Dementia in other diseases classified elsewhere, unspecified severity, with other behavioral disturbance: Secondary | ICD-10-CM | POA: Diagnosis not present

## 2021-06-27 DIAGNOSIS — G309 Alzheimer's disease, unspecified: Secondary | ICD-10-CM | POA: Diagnosis not present

## 2021-06-27 DIAGNOSIS — R42 Dizziness and giddiness: Secondary | ICD-10-CM | POA: Diagnosis not present

## 2021-06-27 DIAGNOSIS — E871 Hypo-osmolality and hyponatremia: Secondary | ICD-10-CM | POA: Diagnosis not present

## 2021-06-27 DIAGNOSIS — I482 Chronic atrial fibrillation, unspecified: Secondary | ICD-10-CM | POA: Diagnosis not present

## 2021-06-28 DIAGNOSIS — B9689 Other specified bacterial agents as the cause of diseases classified elsewhere: Secondary | ICD-10-CM | POA: Diagnosis not present

## 2021-06-28 DIAGNOSIS — L03116 Cellulitis of left lower limb: Secondary | ICD-10-CM | POA: Diagnosis not present

## 2021-06-28 DIAGNOSIS — F0280 Dementia in other diseases classified elsewhere without behavioral disturbance: Secondary | ICD-10-CM | POA: Diagnosis not present

## 2021-06-28 DIAGNOSIS — L97421 Non-pressure chronic ulcer of left heel and midfoot limited to breakdown of skin: Secondary | ICD-10-CM | POA: Diagnosis not present

## 2021-06-28 DIAGNOSIS — I878 Other specified disorders of veins: Secondary | ICD-10-CM | POA: Diagnosis not present

## 2021-06-28 DIAGNOSIS — B965 Pseudomonas (aeruginosa) (mallei) (pseudomallei) as the cause of diseases classified elsewhere: Secondary | ICD-10-CM | POA: Diagnosis not present

## 2021-06-28 DIAGNOSIS — E1140 Type 2 diabetes mellitus with diabetic neuropathy, unspecified: Secondary | ICD-10-CM | POA: Diagnosis not present

## 2021-06-28 DIAGNOSIS — L97429 Non-pressure chronic ulcer of left heel and midfoot with unspecified severity: Secondary | ICD-10-CM | POA: Diagnosis not present

## 2021-06-28 DIAGNOSIS — G309 Alzheimer's disease, unspecified: Secondary | ICD-10-CM | POA: Diagnosis not present

## 2021-06-28 DIAGNOSIS — L98499 Non-pressure chronic ulcer of skin of other sites with unspecified severity: Secondary | ICD-10-CM | POA: Diagnosis not present

## 2021-06-28 DIAGNOSIS — E11621 Type 2 diabetes mellitus with foot ulcer: Secondary | ICD-10-CM | POA: Diagnosis not present

## 2021-06-28 DIAGNOSIS — R600 Localized edema: Secondary | ICD-10-CM | POA: Diagnosis not present

## 2021-06-28 DIAGNOSIS — E1165 Type 2 diabetes mellitus with hyperglycemia: Secondary | ICD-10-CM | POA: Diagnosis not present

## 2021-06-28 DIAGNOSIS — R601 Generalized edema: Secondary | ICD-10-CM | POA: Diagnosis not present

## 2021-06-28 DIAGNOSIS — I898 Other specified noninfective disorders of lymphatic vessels and lymph nodes: Secondary | ICD-10-CM | POA: Diagnosis not present

## 2021-06-28 DIAGNOSIS — B957 Other staphylococcus as the cause of diseases classified elsewhere: Secondary | ICD-10-CM | POA: Diagnosis not present

## 2021-06-28 DIAGNOSIS — E1142 Type 2 diabetes mellitus with diabetic polyneuropathy: Secondary | ICD-10-CM | POA: Diagnosis not present

## 2021-06-28 DIAGNOSIS — E878 Other disorders of electrolyte and fluid balance, not elsewhere classified: Secondary | ICD-10-CM | POA: Diagnosis not present

## 2021-06-28 DIAGNOSIS — L97425 Non-pressure chronic ulcer of left heel and midfoot with muscle involvement without evidence of necrosis: Secondary | ICD-10-CM | POA: Diagnosis not present

## 2021-06-28 DIAGNOSIS — E871 Hypo-osmolality and hyponatremia: Secondary | ICD-10-CM | POA: Diagnosis not present

## 2021-06-29 DIAGNOSIS — L97429 Non-pressure chronic ulcer of left heel and midfoot with unspecified severity: Secondary | ICD-10-CM | POA: Diagnosis not present

## 2021-06-29 DIAGNOSIS — F02818 Dementia in other diseases classified elsewhere, unspecified severity, with other behavioral disturbance: Secondary | ICD-10-CM | POA: Diagnosis not present

## 2021-06-29 DIAGNOSIS — Z66 Do not resuscitate: Secondary | ICD-10-CM | POA: Diagnosis not present

## 2021-06-29 DIAGNOSIS — L97421 Non-pressure chronic ulcer of left heel and midfoot limited to breakdown of skin: Secondary | ICD-10-CM | POA: Diagnosis not present

## 2021-06-29 DIAGNOSIS — I878 Other specified disorders of veins: Secondary | ICD-10-CM | POA: Diagnosis not present

## 2021-06-29 DIAGNOSIS — E1165 Type 2 diabetes mellitus with hyperglycemia: Secondary | ICD-10-CM | POA: Diagnosis not present

## 2021-06-29 DIAGNOSIS — I482 Chronic atrial fibrillation, unspecified: Secondary | ICD-10-CM | POA: Diagnosis not present

## 2021-06-29 DIAGNOSIS — B965 Pseudomonas (aeruginosa) (mallei) (pseudomallei) as the cause of diseases classified elsewhere: Secondary | ICD-10-CM | POA: Diagnosis not present

## 2021-06-29 DIAGNOSIS — B9689 Other specified bacterial agents as the cause of diseases classified elsewhere: Secondary | ICD-10-CM | POA: Diagnosis not present

## 2021-06-29 DIAGNOSIS — E1142 Type 2 diabetes mellitus with diabetic polyneuropathy: Secondary | ICD-10-CM | POA: Diagnosis not present

## 2021-06-29 DIAGNOSIS — G308 Other Alzheimer's disease: Secondary | ICD-10-CM | POA: Diagnosis not present

## 2021-06-29 DIAGNOSIS — E785 Hyperlipidemia, unspecified: Secondary | ICD-10-CM | POA: Diagnosis not present

## 2021-06-29 DIAGNOSIS — E11621 Type 2 diabetes mellitus with foot ulcer: Secondary | ICD-10-CM | POA: Diagnosis not present

## 2021-06-29 DIAGNOSIS — E1140 Type 2 diabetes mellitus with diabetic neuropathy, unspecified: Secondary | ICD-10-CM | POA: Diagnosis not present

## 2021-06-29 DIAGNOSIS — G309 Alzheimer's disease, unspecified: Secondary | ICD-10-CM | POA: Diagnosis not present

## 2021-06-29 DIAGNOSIS — L03116 Cellulitis of left lower limb: Secondary | ICD-10-CM | POA: Diagnosis not present

## 2021-06-29 DIAGNOSIS — B957 Other staphylococcus as the cause of diseases classified elsewhere: Secondary | ICD-10-CM | POA: Diagnosis not present

## 2021-06-29 DIAGNOSIS — E871 Hypo-osmolality and hyponatremia: Secondary | ICD-10-CM | POA: Diagnosis not present

## 2021-06-30 DIAGNOSIS — B957 Other staphylococcus as the cause of diseases classified elsewhere: Secondary | ICD-10-CM | POA: Diagnosis not present

## 2021-06-30 DIAGNOSIS — Z66 Do not resuscitate: Secondary | ICD-10-CM | POA: Diagnosis not present

## 2021-06-30 DIAGNOSIS — B9689 Other specified bacterial agents as the cause of diseases classified elsewhere: Secondary | ICD-10-CM | POA: Diagnosis not present

## 2021-06-30 DIAGNOSIS — L03116 Cellulitis of left lower limb: Secondary | ICD-10-CM | POA: Diagnosis not present

## 2021-06-30 DIAGNOSIS — I878 Other specified disorders of veins: Secondary | ICD-10-CM | POA: Diagnosis not present

## 2021-06-30 DIAGNOSIS — E11621 Type 2 diabetes mellitus with foot ulcer: Secondary | ICD-10-CM | POA: Diagnosis not present

## 2021-06-30 DIAGNOSIS — L97401 Non-pressure chronic ulcer of unspecified heel and midfoot limited to breakdown of skin: Secondary | ICD-10-CM | POA: Diagnosis not present

## 2021-06-30 DIAGNOSIS — I482 Chronic atrial fibrillation, unspecified: Secondary | ICD-10-CM | POA: Diagnosis not present

## 2021-06-30 DIAGNOSIS — E785 Hyperlipidemia, unspecified: Secondary | ICD-10-CM | POA: Diagnosis not present

## 2021-06-30 DIAGNOSIS — E1142 Type 2 diabetes mellitus with diabetic polyneuropathy: Secondary | ICD-10-CM | POA: Diagnosis not present

## 2021-06-30 DIAGNOSIS — Z794 Long term (current) use of insulin: Secondary | ICD-10-CM | POA: Diagnosis not present

## 2021-06-30 DIAGNOSIS — E1140 Type 2 diabetes mellitus with diabetic neuropathy, unspecified: Secondary | ICD-10-CM | POA: Diagnosis not present

## 2021-06-30 DIAGNOSIS — F02818 Dementia in other diseases classified elsewhere, unspecified severity, with other behavioral disturbance: Secondary | ICD-10-CM | POA: Diagnosis not present

## 2021-06-30 DIAGNOSIS — L97429 Non-pressure chronic ulcer of left heel and midfoot with unspecified severity: Secondary | ICD-10-CM | POA: Diagnosis not present

## 2021-06-30 DIAGNOSIS — G309 Alzheimer's disease, unspecified: Secondary | ICD-10-CM | POA: Diagnosis not present

## 2021-06-30 DIAGNOSIS — E871 Hypo-osmolality and hyponatremia: Secondary | ICD-10-CM | POA: Diagnosis not present

## 2021-06-30 DIAGNOSIS — L97421 Non-pressure chronic ulcer of left heel and midfoot limited to breakdown of skin: Secondary | ICD-10-CM | POA: Diagnosis not present

## 2021-06-30 DIAGNOSIS — B965 Pseudomonas (aeruginosa) (mallei) (pseudomallei) as the cause of diseases classified elsewhere: Secondary | ICD-10-CM | POA: Diagnosis not present

## 2021-06-30 DIAGNOSIS — E1165 Type 2 diabetes mellitus with hyperglycemia: Secondary | ICD-10-CM | POA: Diagnosis not present

## 2021-07-01 DIAGNOSIS — E1142 Type 2 diabetes mellitus with diabetic polyneuropathy: Secondary | ICD-10-CM | POA: Diagnosis not present

## 2021-07-01 DIAGNOSIS — L03116 Cellulitis of left lower limb: Secondary | ICD-10-CM | POA: Diagnosis not present

## 2021-07-01 DIAGNOSIS — E1165 Type 2 diabetes mellitus with hyperglycemia: Secondary | ICD-10-CM | POA: Diagnosis not present

## 2021-07-01 DIAGNOSIS — F02818 Dementia in other diseases classified elsewhere, unspecified severity, with other behavioral disturbance: Secondary | ICD-10-CM | POA: Diagnosis not present

## 2021-07-01 DIAGNOSIS — E871 Hypo-osmolality and hyponatremia: Secondary | ICD-10-CM | POA: Diagnosis not present

## 2021-07-01 DIAGNOSIS — L97429 Non-pressure chronic ulcer of left heel and midfoot with unspecified severity: Secondary | ICD-10-CM | POA: Diagnosis not present

## 2021-07-01 DIAGNOSIS — Z794 Long term (current) use of insulin: Secondary | ICD-10-CM | POA: Diagnosis not present

## 2021-07-01 DIAGNOSIS — E11621 Type 2 diabetes mellitus with foot ulcer: Secondary | ICD-10-CM | POA: Diagnosis not present

## 2021-07-01 DIAGNOSIS — I482 Chronic atrial fibrillation, unspecified: Secondary | ICD-10-CM | POA: Diagnosis not present

## 2021-07-01 DIAGNOSIS — E785 Hyperlipidemia, unspecified: Secondary | ICD-10-CM | POA: Diagnosis not present

## 2021-07-01 DIAGNOSIS — Z66 Do not resuscitate: Secondary | ICD-10-CM | POA: Diagnosis not present

## 2021-07-01 DIAGNOSIS — L97421 Non-pressure chronic ulcer of left heel and midfoot limited to breakdown of skin: Secondary | ICD-10-CM | POA: Diagnosis not present

## 2021-07-02 DIAGNOSIS — L97421 Non-pressure chronic ulcer of left heel and midfoot limited to breakdown of skin: Secondary | ICD-10-CM | POA: Diagnosis not present

## 2021-07-02 DIAGNOSIS — E1142 Type 2 diabetes mellitus with diabetic polyneuropathy: Secondary | ICD-10-CM | POA: Diagnosis not present

## 2021-07-02 DIAGNOSIS — Z794 Long term (current) use of insulin: Secondary | ICD-10-CM | POA: Diagnosis not present

## 2021-07-02 DIAGNOSIS — E1165 Type 2 diabetes mellitus with hyperglycemia: Secondary | ICD-10-CM | POA: Diagnosis not present

## 2021-07-02 DIAGNOSIS — E11621 Type 2 diabetes mellitus with foot ulcer: Secondary | ICD-10-CM | POA: Diagnosis not present

## 2021-07-02 DIAGNOSIS — F02818 Dementia in other diseases classified elsewhere, unspecified severity, with other behavioral disturbance: Secondary | ICD-10-CM | POA: Diagnosis not present

## 2021-07-02 DIAGNOSIS — E785 Hyperlipidemia, unspecified: Secondary | ICD-10-CM | POA: Diagnosis not present

## 2021-07-02 DIAGNOSIS — I160 Hypertensive urgency: Secondary | ICD-10-CM | POA: Diagnosis not present

## 2021-07-02 DIAGNOSIS — I482 Chronic atrial fibrillation, unspecified: Secondary | ICD-10-CM | POA: Diagnosis not present

## 2021-07-02 DIAGNOSIS — E871 Hypo-osmolality and hyponatremia: Secondary | ICD-10-CM | POA: Diagnosis not present

## 2021-07-02 DIAGNOSIS — L97401 Non-pressure chronic ulcer of unspecified heel and midfoot limited to breakdown of skin: Secondary | ICD-10-CM | POA: Diagnosis not present

## 2021-07-02 DIAGNOSIS — L97429 Non-pressure chronic ulcer of left heel and midfoot with unspecified severity: Secondary | ICD-10-CM | POA: Diagnosis not present

## 2021-07-02 DIAGNOSIS — I10 Essential (primary) hypertension: Secondary | ICD-10-CM | POA: Diagnosis not present

## 2021-07-02 DIAGNOSIS — Z66 Do not resuscitate: Secondary | ICD-10-CM | POA: Diagnosis not present

## 2021-07-02 DIAGNOSIS — L03116 Cellulitis of left lower limb: Secondary | ICD-10-CM | POA: Diagnosis not present

## 2021-07-03 DIAGNOSIS — Z66 Do not resuscitate: Secondary | ICD-10-CM | POA: Diagnosis not present

## 2021-07-03 DIAGNOSIS — L97513 Non-pressure chronic ulcer of other part of right foot with necrosis of muscle: Secondary | ICD-10-CM | POA: Diagnosis not present

## 2021-07-03 DIAGNOSIS — L97429 Non-pressure chronic ulcer of left heel and midfoot with unspecified severity: Secondary | ICD-10-CM | POA: Diagnosis not present

## 2021-07-03 DIAGNOSIS — E1140 Type 2 diabetes mellitus with diabetic neuropathy, unspecified: Secondary | ICD-10-CM | POA: Diagnosis not present

## 2021-07-03 DIAGNOSIS — I480 Paroxysmal atrial fibrillation: Secondary | ICD-10-CM | POA: Diagnosis not present

## 2021-07-03 DIAGNOSIS — E860 Dehydration: Secondary | ICD-10-CM | POA: Diagnosis not present

## 2021-07-03 DIAGNOSIS — L97523 Non-pressure chronic ulcer of other part of left foot with necrosis of muscle: Secondary | ICD-10-CM | POA: Diagnosis not present

## 2021-07-03 DIAGNOSIS — F02B11 Dementia in other diseases classified elsewhere, moderate, with agitation: Secondary | ICD-10-CM | POA: Diagnosis not present

## 2021-07-03 DIAGNOSIS — I739 Peripheral vascular disease, unspecified: Secondary | ICD-10-CM | POA: Diagnosis not present

## 2021-07-03 DIAGNOSIS — E871 Hypo-osmolality and hyponatremia: Secondary | ICD-10-CM | POA: Diagnosis not present

## 2021-07-03 DIAGNOSIS — I878 Other specified disorders of veins: Secondary | ICD-10-CM | POA: Diagnosis not present

## 2021-07-03 DIAGNOSIS — F02811 Dementia in other diseases classified elsewhere, unspecified severity, with agitation: Secondary | ICD-10-CM | POA: Diagnosis not present

## 2021-07-03 DIAGNOSIS — F02B18 Dementia in other diseases classified elsewhere, moderate, with other behavioral disturbance: Secondary | ICD-10-CM | POA: Diagnosis not present

## 2021-07-03 DIAGNOSIS — B965 Pseudomonas (aeruginosa) (mallei) (pseudomallei) as the cause of diseases classified elsewhere: Secondary | ICD-10-CM | POA: Diagnosis not present

## 2021-07-03 DIAGNOSIS — E1165 Type 2 diabetes mellitus with hyperglycemia: Secondary | ICD-10-CM | POA: Diagnosis not present

## 2021-07-03 DIAGNOSIS — Z9189 Other specified personal risk factors, not elsewhere classified: Secondary | ICD-10-CM | POA: Diagnosis not present

## 2021-07-03 DIAGNOSIS — L03116 Cellulitis of left lower limb: Secondary | ICD-10-CM | POA: Diagnosis not present

## 2021-07-03 DIAGNOSIS — G4700 Insomnia, unspecified: Secondary | ICD-10-CM | POA: Diagnosis not present

## 2021-07-03 DIAGNOSIS — I482 Chronic atrial fibrillation, unspecified: Secondary | ICD-10-CM | POA: Diagnosis not present

## 2021-07-03 DIAGNOSIS — N179 Acute kidney failure, unspecified: Secondary | ICD-10-CM | POA: Diagnosis not present

## 2021-07-03 DIAGNOSIS — F02818 Dementia in other diseases classified elsewhere, unspecified severity, with other behavioral disturbance: Secondary | ICD-10-CM | POA: Diagnosis not present

## 2021-07-03 DIAGNOSIS — E669 Obesity, unspecified: Secondary | ICD-10-CM | POA: Diagnosis not present

## 2021-07-03 DIAGNOSIS — F05 Delirium due to known physiological condition: Secondary | ICD-10-CM | POA: Diagnosis not present

## 2021-07-03 DIAGNOSIS — F02B2 Dementia in other diseases classified elsewhere, moderate, with psychotic disturbance: Secondary | ICD-10-CM | POA: Diagnosis not present

## 2021-07-03 DIAGNOSIS — Z743 Need for continuous supervision: Secondary | ICD-10-CM | POA: Diagnosis not present

## 2021-07-03 DIAGNOSIS — B9689 Other specified bacterial agents as the cause of diseases classified elsewhere: Secondary | ICD-10-CM | POA: Diagnosis not present

## 2021-07-03 DIAGNOSIS — E1142 Type 2 diabetes mellitus with diabetic polyneuropathy: Secondary | ICD-10-CM | POA: Diagnosis not present

## 2021-07-03 DIAGNOSIS — L97421 Non-pressure chronic ulcer of left heel and midfoot limited to breakdown of skin: Secondary | ICD-10-CM | POA: Diagnosis not present

## 2021-07-03 DIAGNOSIS — R279 Unspecified lack of coordination: Secondary | ICD-10-CM | POA: Diagnosis not present

## 2021-07-03 DIAGNOSIS — I509 Heart failure, unspecified: Secondary | ICD-10-CM | POA: Diagnosis not present

## 2021-07-03 DIAGNOSIS — I110 Hypertensive heart disease with heart failure: Secondary | ICD-10-CM | POA: Diagnosis not present

## 2021-07-03 DIAGNOSIS — E11621 Type 2 diabetes mellitus with foot ulcer: Secondary | ICD-10-CM | POA: Diagnosis not present

## 2021-07-03 DIAGNOSIS — E785 Hyperlipidemia, unspecified: Secondary | ICD-10-CM | POA: Diagnosis not present

## 2021-07-03 DIAGNOSIS — I10 Essential (primary) hypertension: Secondary | ICD-10-CM | POA: Diagnosis not present

## 2021-07-03 DIAGNOSIS — L97519 Non-pressure chronic ulcer of other part of right foot with unspecified severity: Secondary | ICD-10-CM | POA: Diagnosis not present

## 2021-07-03 DIAGNOSIS — R1310 Dysphagia, unspecified: Secondary | ICD-10-CM | POA: Diagnosis not present

## 2021-07-03 DIAGNOSIS — B957 Other staphylococcus as the cause of diseases classified elsewhere: Secondary | ICD-10-CM | POA: Diagnosis not present

## 2021-07-03 DIAGNOSIS — G309 Alzheimer's disease, unspecified: Secondary | ICD-10-CM | POA: Diagnosis not present

## 2021-07-03 DIAGNOSIS — K921 Melena: Secondary | ICD-10-CM | POA: Diagnosis not present

## 2021-07-03 DIAGNOSIS — I4891 Unspecified atrial fibrillation: Secondary | ICD-10-CM | POA: Diagnosis not present

## 2021-07-04 DIAGNOSIS — E11621 Type 2 diabetes mellitus with foot ulcer: Secondary | ICD-10-CM | POA: Diagnosis not present

## 2021-07-04 DIAGNOSIS — L97429 Non-pressure chronic ulcer of left heel and midfoot with unspecified severity: Secondary | ICD-10-CM | POA: Diagnosis not present

## 2021-07-04 DIAGNOSIS — F02B2 Dementia in other diseases classified elsewhere, moderate, with psychotic disturbance: Secondary | ICD-10-CM | POA: Diagnosis not present

## 2021-07-04 DIAGNOSIS — I4891 Unspecified atrial fibrillation: Secondary | ICD-10-CM | POA: Diagnosis not present

## 2021-07-04 DIAGNOSIS — F05 Delirium due to known physiological condition: Secondary | ICD-10-CM | POA: Diagnosis not present

## 2021-07-04 DIAGNOSIS — E1140 Type 2 diabetes mellitus with diabetic neuropathy, unspecified: Secondary | ICD-10-CM | POA: Diagnosis not present

## 2021-07-04 DIAGNOSIS — R1310 Dysphagia, unspecified: Secondary | ICD-10-CM | POA: Diagnosis not present

## 2021-07-04 DIAGNOSIS — G309 Alzheimer's disease, unspecified: Secondary | ICD-10-CM | POA: Diagnosis not present

## 2021-07-04 DIAGNOSIS — I10 Essential (primary) hypertension: Secondary | ICD-10-CM | POA: Diagnosis not present

## 2021-07-06 DIAGNOSIS — G309 Alzheimer's disease, unspecified: Secondary | ICD-10-CM | POA: Diagnosis not present

## 2021-07-06 DIAGNOSIS — F02B11 Dementia in other diseases classified elsewhere, moderate, with agitation: Secondary | ICD-10-CM | POA: Diagnosis not present

## 2021-07-06 DIAGNOSIS — F02B18 Dementia in other diseases classified elsewhere, moderate, with other behavioral disturbance: Secondary | ICD-10-CM | POA: Diagnosis not present

## 2021-07-10 DIAGNOSIS — F02811 Dementia in other diseases classified elsewhere, unspecified severity, with agitation: Secondary | ICD-10-CM | POA: Diagnosis not present

## 2021-07-10 DIAGNOSIS — G309 Alzheimer's disease, unspecified: Secondary | ICD-10-CM | POA: Diagnosis not present

## 2021-07-10 DIAGNOSIS — Z9189 Other specified personal risk factors, not elsewhere classified: Secondary | ICD-10-CM | POA: Diagnosis not present

## 2021-07-10 DIAGNOSIS — K921 Melena: Secondary | ICD-10-CM | POA: Diagnosis not present

## 2021-07-10 DIAGNOSIS — F02818 Dementia in other diseases classified elsewhere, unspecified severity, with other behavioral disturbance: Secondary | ICD-10-CM | POA: Diagnosis not present

## 2021-07-10 DIAGNOSIS — G4700 Insomnia, unspecified: Secondary | ICD-10-CM | POA: Diagnosis not present

## 2021-07-14 DIAGNOSIS — I739 Peripheral vascular disease, unspecified: Secondary | ICD-10-CM | POA: Diagnosis not present

## 2021-07-17 DIAGNOSIS — E860 Dehydration: Secondary | ICD-10-CM | POA: Diagnosis not present

## 2021-07-17 DIAGNOSIS — N179 Acute kidney failure, unspecified: Secondary | ICD-10-CM | POA: Diagnosis not present

## 2021-07-17 DIAGNOSIS — G309 Alzheimer's disease, unspecified: Secondary | ICD-10-CM | POA: Diagnosis not present

## 2021-07-17 DIAGNOSIS — G4700 Insomnia, unspecified: Secondary | ICD-10-CM | POA: Diagnosis not present

## 2021-07-17 DIAGNOSIS — L97429 Non-pressure chronic ulcer of left heel and midfoot with unspecified severity: Secondary | ICD-10-CM | POA: Diagnosis not present

## 2021-07-17 DIAGNOSIS — F02B18 Dementia in other diseases classified elsewhere, moderate, with other behavioral disturbance: Secondary | ICD-10-CM | POA: Diagnosis not present

## 2021-07-17 DIAGNOSIS — L97519 Non-pressure chronic ulcer of other part of right foot with unspecified severity: Secondary | ICD-10-CM | POA: Diagnosis not present

## 2021-07-17 DIAGNOSIS — E11621 Type 2 diabetes mellitus with foot ulcer: Secondary | ICD-10-CM | POA: Diagnosis not present

## 2021-07-18 DIAGNOSIS — N179 Acute kidney failure, unspecified: Secondary | ICD-10-CM | POA: Diagnosis not present

## 2021-07-18 DIAGNOSIS — F02B2 Dementia in other diseases classified elsewhere, moderate, with psychotic disturbance: Secondary | ICD-10-CM | POA: Diagnosis not present

## 2021-07-18 DIAGNOSIS — L97429 Non-pressure chronic ulcer of left heel and midfoot with unspecified severity: Secondary | ICD-10-CM | POA: Diagnosis not present

## 2021-07-18 DIAGNOSIS — E11621 Type 2 diabetes mellitus with foot ulcer: Secondary | ICD-10-CM | POA: Diagnosis not present

## 2021-07-18 DIAGNOSIS — G309 Alzheimer's disease, unspecified: Secondary | ICD-10-CM | POA: Diagnosis not present

## 2021-07-18 DIAGNOSIS — F02B11 Dementia in other diseases classified elsewhere, moderate, with agitation: Secondary | ICD-10-CM | POA: Diagnosis not present

## 2021-07-20 DIAGNOSIS — L97429 Non-pressure chronic ulcer of left heel and midfoot with unspecified severity: Secondary | ICD-10-CM | POA: Diagnosis not present

## 2021-07-20 DIAGNOSIS — L97412 Non-pressure chronic ulcer of right heel and midfoot with fat layer exposed: Secondary | ICD-10-CM | POA: Diagnosis not present

## 2021-07-20 DIAGNOSIS — L97513 Non-pressure chronic ulcer of other part of right foot with necrosis of muscle: Secondary | ICD-10-CM | POA: Diagnosis not present

## 2021-07-21 ENCOUNTER — Other Ambulatory Visit: Payer: Self-pay

## 2021-07-21 NOTE — Patient Outreach (Signed)
[facility]) Care Management  07/21/2021  Manuel Turner 04/03/1946 [id number]     Transition of Care Referral  Referral Date: 07/21/2021 Referral Source: Discharge Report Date of Discharge: 07/20/2021 Facility: [facility]     Referral received. Upon chart review and PING patient admitted to [facility].     Plan: RN CM will close referral.   [name], RN,BSN,CCM [facility] Care Management Telephonic Care Management Coordinator Direct Phone: [phone] Toll Free: [phone] Fax: [phone]

## 2021-07-24 DIAGNOSIS — F02811 Dementia in other diseases classified elsewhere, unspecified severity, with agitation: Secondary | ICD-10-CM | POA: Diagnosis not present

## 2021-07-24 DIAGNOSIS — G309 Alzheimer's disease, unspecified: Secondary | ICD-10-CM | POA: Diagnosis not present

## 2021-07-24 DIAGNOSIS — N179 Acute kidney failure, unspecified: Secondary | ICD-10-CM | POA: Diagnosis not present

## 2021-07-24 DIAGNOSIS — Z66 Do not resuscitate: Secondary | ICD-10-CM | POA: Diagnosis not present

## 2021-07-24 DIAGNOSIS — L97429 Non-pressure chronic ulcer of left heel and midfoot with unspecified severity: Secondary | ICD-10-CM | POA: Diagnosis not present

## 2021-07-24 DIAGNOSIS — E11621 Type 2 diabetes mellitus with foot ulcer: Secondary | ICD-10-CM | POA: Diagnosis not present

## 2021-07-27 DIAGNOSIS — L97422 Non-pressure chronic ulcer of left heel and midfoot with fat layer exposed: Secondary | ICD-10-CM | POA: Diagnosis not present

## 2021-07-27 DIAGNOSIS — L97529 Non-pressure chronic ulcer of other part of left foot with unspecified severity: Secondary | ICD-10-CM | POA: Diagnosis not present

## 2021-07-27 DIAGNOSIS — L97513 Non-pressure chronic ulcer of other part of right foot with necrosis of muscle: Secondary | ICD-10-CM | POA: Diagnosis not present

## 2021-08-20 DEATH — deceased
